# Patient Record
Sex: Female | Born: 1955 | Race: White | Hispanic: No | Smoking: Never smoker
Health system: Southern US, Community
[De-identification: ages and names within clinical notes are randomized; demographics above are authoritative.]

## PROBLEM LIST (undated history)

## (undated) DIAGNOSIS — I10 Essential (primary) hypertension: Secondary | ICD-10-CM

## (undated) DIAGNOSIS — E119 Type 2 diabetes mellitus without complications: Secondary | ICD-10-CM

---

## 2011-01-27 ENCOUNTER — Ambulatory Visit: Payer: Self-pay | Admitting: Internal Medicine

## 2013-03-06 ENCOUNTER — Ambulatory Visit: Payer: Self-pay | Admitting: Internal Medicine

## 2014-04-01 ENCOUNTER — Ambulatory Visit: Payer: Self-pay | Admitting: Internal Medicine

## 2014-10-05 ENCOUNTER — Emergency Department
Admission: EM | Admit: 2014-10-05 | Discharge: 2014-10-06 | Disposition: A | Discharge status: Home / Self Care | Payer: BLUE CROSS/BLUE SHIELD | Attending: Emergency Medicine | Admitting: Emergency Medicine

## 2014-10-05 ENCOUNTER — Encounter: Payer: Self-pay | Admitting: Emergency Medicine

## 2014-10-05 DIAGNOSIS — Z79899 Other long term (current) drug therapy: Secondary | ICD-10-CM | POA: Diagnosis not present

## 2014-10-05 DIAGNOSIS — R1031 Right lower quadrant pain: Secondary | ICD-10-CM | POA: Diagnosis present

## 2014-10-05 DIAGNOSIS — N23 Unspecified renal colic: Secondary | ICD-10-CM

## 2014-10-05 DIAGNOSIS — I10 Essential (primary) hypertension: Secondary | ICD-10-CM | POA: Diagnosis not present

## 2014-10-05 DIAGNOSIS — E119 Type 2 diabetes mellitus without complications: Secondary | ICD-10-CM | POA: Insufficient documentation

## 2014-10-05 HISTORY — DX: Essential (primary) hypertension: I10

## 2014-10-05 HISTORY — DX: Type 2 diabetes mellitus without complications: E11.9

## 2014-10-05 LAB — COMPREHENSIVE METABOLIC PANEL
ALK PHOS: 71 U/L (ref 38–126)
ALT: 32 U/L (ref 14–54)
AST: 33 U/L (ref 15–41)
Albumin: 4.6 g/dL (ref 3.5–5.0)
Anion gap: 13 (ref 5–15)
BUN: 22 mg/dL — AB (ref 6–20)
CALCIUM: 10.1 mg/dL (ref 8.9–10.3)
CO2: 25 mmol/L (ref 22–32)
Chloride: 104 mmol/L (ref 101–111)
Creatinine, Ser: 1.13 mg/dL — ABNORMAL HIGH (ref 0.44–1.00)
GFR calc Af Amer: 60 mL/min — ABNORMAL LOW (ref >60)
GFR calc non Af Amer: 52 mL/min — ABNORMAL LOW (ref >60)
Glucose, Bld: 144 mg/dL — ABNORMAL HIGH (ref 65–99)
Potassium: 3 mmol/L — ABNORMAL LOW (ref 3.5–5.1)
SODIUM: 142 mmol/L (ref 135–145)
TOTAL PROTEIN: 8.3 g/dL — AB (ref 6.5–8.1)
Total Bilirubin: 0.7 mg/dL (ref 0.3–1.2)

## 2014-10-05 LAB — CBC WITH DIFFERENTIAL/PLATELET
Basophils Absolute: 0.1 10*3/uL (ref 0–0.1)
Basophils Relative: 1 %
EOS ABS: 0.3 10*3/uL (ref 0–0.7)
Eosinophils Relative: 3 %
HEMATOCRIT: 40.7 % (ref 35.0–47.0)
HEMOGLOBIN: 13.4 g/dL (ref 12.0–16.0)
LYMPHS ABS: 2.9 10*3/uL (ref 1.0–3.6)
Lymphocytes Relative: 32 %
MCH: 29.6 pg (ref 26.0–34.0)
MCHC: 32.9 g/dL (ref 32.0–36.0)
MCV: 90 fL (ref 80.0–100.0)
MONOS PCT: 7 %
Monocytes Absolute: 0.6 10*3/uL (ref 0.2–0.9)
Neutro Abs: 5.3 10*3/uL (ref 1.4–6.5)
Neutrophils Relative %: 57 %
PLATELETS: 202 10*3/uL (ref 150–440)
RBC: 4.52 MIL/uL (ref 3.80–5.20)
RDW: 13.2 % (ref 11.5–14.5)
WBC: 9.2 10*3/uL (ref 3.6–11.0)

## 2014-10-05 LAB — TROPONIN I: Troponin I: <0.03 ng/mL (ref <0.031)

## 2014-10-05 LAB — LIPASE, BLOOD: Lipase: 41 U/L (ref 22–51)

## 2014-10-05 MED ORDER — ONDANSETRON HCL 4 MG/2ML IJ SOLN
4.0000 mg | Freq: Once | INTRAMUSCULAR | Status: AC
  Administered 2014-10-05: 4 mg via INTRAVENOUS

## 2014-10-05 MED ORDER — HYDROMORPHONE HCL 1 MG/ML IJ SOLN
1.0000 mg | INTRAMUSCULAR | Status: AC
  Administered 2014-10-05: 1 mg via INTRAVENOUS

## 2014-10-05 MED ORDER — ONDANSETRON HCL 4 MG/2ML IJ SOLN
INTRAMUSCULAR | Status: AC
  Filled 2014-10-05: qty 2

## 2014-10-05 MED ORDER — FENTANYL CITRATE (PF) 100 MCG/2ML IJ SOLN
50.0000 ug | Freq: Once | INTRAMUSCULAR | Status: AC
  Administered 2014-10-05: 50 ug via INTRAVENOUS

## 2014-10-05 MED ORDER — HYDROMORPHONE HCL 1 MG/ML IJ SOLN
INTRAMUSCULAR | Status: AC
  Administered 2014-10-05: 1 mg via INTRAVENOUS
  Filled 2014-10-05: qty 1

## 2014-10-05 MED ORDER — SODIUM CHLORIDE 0.9 % IV SOLN
Freq: Once | INTRAVENOUS | Status: AC
  Administered 2014-10-05: 23:00:00 via INTRAVENOUS

## 2014-10-05 MED ORDER — FENTANYL CITRATE (PF) 100 MCG/2ML IJ SOLN
INTRAMUSCULAR | Status: AC
  Filled 2014-10-05: qty 2

## 2014-10-05 MED ORDER — MORPHINE SULFATE 4 MG/ML IJ SOLN
4.0000 mg | Freq: Once | INTRAMUSCULAR | Status: AC
  Administered 2014-10-05: 4 mg via INTRAVENOUS

## 2014-10-05 MED ORDER — MORPHINE SULFATE 4 MG/ML IJ SOLN
INTRAMUSCULAR | Status: AC
  Administered 2014-10-05: 4 mg via INTRAVENOUS
  Filled 2014-10-05: qty 1

## 2014-10-05 NOTE — ED Notes (Signed)
MD at bedside. 

## 2014-10-05 NOTE — ED Notes (Signed)
Pt has eyes closed resting in bed with husband at bedside. Pt has become increasingly quite since arrival and has become increasingly pale in face. MD notified.

## 2014-10-05 NOTE — ED Notes (Signed)
Patient here with complaint of right lower abd and back pain that started about an hour ago. Patient with some nausea, denies vomiting.

## 2014-10-06 ENCOUNTER — Emergency Department: Payer: BLUE CROSS/BLUE SHIELD

## 2014-10-06 LAB — URINALYSIS COMPLETE WITH MICROSCOPIC (ARMC ONLY)
Bacteria, UA: NONE SEEN
Bilirubin Urine: NEGATIVE
GLUCOSE, UA: NEGATIVE mg/dL
Leukocytes, UA: NEGATIVE
NITRITE: POSITIVE — AB
PROTEIN: NEGATIVE mg/dL
SPECIFIC GRAVITY, URINE: 1.024 (ref 1.005–1.030)
pH: 8 (ref 5.0–8.0)

## 2014-10-06 MED ORDER — POLYETHYLENE GLYCOL 3350 17 GM/SCOOP PO POWD: ORAL | Status: DC

## 2014-10-06 MED ORDER — OXYCODONE-ACETAMINOPHEN 5-325 MG PO TABS: 1.0000 | ORAL_TABLET | Freq: Four times a day (QID) | ORAL | Status: DC | PRN

## 2014-10-06 MED ORDER — PHENAZOPYRIDINE HCL 200 MG PO TABS: 200.0000 mg | ORAL_TABLET | Freq: Three times a day (TID) | ORAL | Status: DC | PRN

## 2014-10-06 MED ORDER — IOHEXOL 300 MG/ML  SOLN
100.0000 mL | Freq: Once | INTRAMUSCULAR | Status: AC | PRN
  Administered 2014-10-06: 100 mL via INTRAVENOUS

## 2014-10-06 MED ORDER — TAMSULOSIN HCL 0.4 MG PO CAPS: 0.4000 mg | ORAL_CAPSULE | Freq: Every day | ORAL | Status: DC

## 2014-10-06 MED ORDER — SENNA 8.6 MG PO TABS: 2.0000 | ORAL_TABLET | Freq: Two times a day (BID) | ORAL | Status: DC

## 2014-10-06 MED ORDER — DOCUSATE SODIUM 100 MG PO CAPS: 200.0000 mg | ORAL_CAPSULE | Freq: Two times a day (BID) | ORAL | Status: DC

## 2014-10-06 MED ORDER — IOHEXOL 240 MG/ML SOLN
25.0000 mL | Freq: Once | INTRAMUSCULAR | Status: AC | PRN
  Administered 2014-10-06: 25 mL via ORAL

## 2014-10-06 NOTE — ED Notes (Signed)
Pt to CT scan.

## 2014-10-06 NOTE — ED Provider Notes (Signed)
[facility] [place] Emergency Department Provider Note  ____________________________________________  Time seen: 11:15 PM  I have reviewed the triage vital signs and the nursing notes.   HISTORY  Chief Complaint Abdominal Pain    HPI Megan Shaw is a 59 y.o. female who complains of right lower quadrant abdominal pain radiating to her back starting about 10 PM tonight. Never had pain like this before. No aggravating or alleviating factors, constant since onset, sharp, severe. No dysuria frequency urgency hematuria. No vaginal burning or discharge. No history of ovarian cysts. No abdominal surgery.  She notes she is all he had 2 bowel movements in the last 2 weeks.No vomiting, normal oral intake of food and liquids. No fever or chills.     Past Medical History  Diagnosis Date  . Diabetes mellitus without complication   . Hypertension     There are no active problems to display for this patient.   No past surgical history on file.  Current Outpatient Rx  Name  Route  Sig  Dispense  Refill  . docusate sodium (COLACE) 100 MG capsule   Oral   Take 2 capsules (200 mg total) by mouth 2 (two) times daily.   120 capsule   0   . oxyCODONE-acetaminophen (ROXICET) 5-325 MG per tablet   Oral   Take 1 tablet by mouth every 6 (six) hours as needed for severe pain.   8 tablet   0   . phenazopyridine (PYRIDIUM) 200 MG tablet   Oral   Take 1 tablet (200 mg total) by mouth 3 (three) times daily as needed for pain.   10 tablet   0   . polyethylene glycol powder (GLYCOLAX/MIRALAX) powder      2 cap fulls in a full glass of water, three times a day, for 5 days.   255 g   0   . senna (SENOKOT) 8.6 MG TABS tablet   Oral   Take 2 tablets (17.2 mg total) by mouth 2 (two) times daily.   120 each   0   . tamsulosin (FLOMAX) 0.4 MG CAPS capsule   Oral   Take 1 capsule (0.4 mg total) by mouth daily.   30 capsule   0     Allergies Review of patient's  allergies indicates no known allergies.  No family history on file.  Social History History  Substance Use Topics  . Smoking status: Never Smoker   . Smokeless tobacco: Not on file  . Alcohol Use: No    Review of Systems  Constitutional: No fever or chills. No weight changes Eyes:No blurry vision or double vision.  ENT: No sore throat. Cardiovascular: No chest pain. Respiratory: No dyspnea or cough. Gastrointestinal: Abdominal pain as above.  No BRBPR or melena. Genitourinary: Negative for dysuria, urinary retention, bloody urine, or difficulty urinating. Musculoskeletal: Negative for back pain. No joint swelling or pain. Skin: Negative for rash. Neurological: Negative for headaches, focal weakness or numbness. Psychiatric:No anxiety or depression.   Endocrine:No hot/cold intolerance, changes in energy, or sleep difficulty.  10-point ROS otherwise negative.  ____________________________________________   PHYSICAL EXAM:  VITAL SIGNS: ED Triage Vitals  Enc Vitals Group     BP 10/05/14 2151 133/64 mmHg     Pulse Rate 10/05/14 2151 75     Resp 10/05/14 2151 18     Temp 10/05/14 2156 97.5 F (36.4 C)     Temp Source 10/05/14 2156 Oral     SpO2 10/05/14 2151 100 %  Weight 10/05/14 2151 160 lb (72.576 kg)     Height 10/05/14 2151 5\' 6"  (1.676 m)     Head Cir --      Peak Flow --      Pain Score 10/05/14 2152 10     Pain Loc --      Pain Edu? --      Excl. in GC? --      Constitutional: Alert and oriented. Well appearing and in no distress. Eyes: No scleral icterus. No conjunctival pallor. PERRL. EOMI ENT   Head: Normocephalic and atraumatic.   Nose: No congestion/rhinnorhea. No septal hematoma   Mouth/Throat: MMM, no pharyngeal erythema. No peritonsillar mass. No uvula shift.   Neck: No stridor. No SubQ emphysema. No meningismus. Hematological/Lymphatic/Immunilogical: No cervical lymphadenopathy. Cardiovascular: RRR. Normal and symmetric distal  pulses are present in all extremities. No murmurs, rubs, or gallops. Respiratory: Normal respiratory effort without tachypnea nor retractions. Breath sounds are clear and equal bilaterally. No wheezes/rales/rhonchi. Gastrointestinal: Soft with right lower quadrant tenderness. There is moderate distention of the whole abdomen without tympany. There is fullness to palpation. There is no CVA tenderness.  No rebound, rigidity, or guarding. Genitourinary: deferred Musculoskeletal: Nontender with normal range of motion in all extremities. No joint effusions.  No lower extremity tenderness.  No edema. Neurologic:   Normal speech and language.  CN 2-10 normal. Motor grossly intact. No pronator drift.  Normal gait. No gross focal neurologic deficits are appreciated.  Skin:  Skin is warm, dry and intact. No rash noted.  No petechiae, purpura, or bullae. Psychiatric: Mood and affect are normal. Speech and behavior are normal. Patient exhibits appropriate insight and judgment.  ____________________________________________    LABS (pertinent positives/negatives) (all labs ordered are listed, but only abnormal results are displayed) Labs Reviewed  COMPREHENSIVE METABOLIC PANEL - Abnormal; Notable for the following:    Potassium 3.0 (*)    Glucose, Bld 144 (*)    BUN 22 (*)    Creatinine, Ser 1.13 (*)    Total Protein 8.3 (*)    GFR calc non Af Amer 52 (*)    GFR calc Af Amer 60 (*)    All other components within normal limits  URINALYSIS COMPLETEWITH MICROSCOPIC ([facility] ONLY) - Abnormal; Notable for the following:    Color, Urine YELLOW (*)    APPearance CLEAR (*)    Ketones, ur TRACE (*)    Hgb urine dipstick 1+ (*)    Nitrite POSITIVE (*)    Squamous Epithelial / LPF 0-5 (*)    All other components within normal limits  CBC WITH DIFFERENTIAL/PLATELET  LIPASE, BLOOD  TROPONIN I   ____________________________________________   EKG    ____________________________________________     RADIOLOGY  CT abdomen and pelvis reveals 3 mm right ureteropelvic junction stone, otherwise unremarkable. Multiple incidental findings which were discussed with the patient. The patient is up-to-date on mammography and other screening.  ____________________________________________   PROCEDURES  ____________________________________________   INITIAL IMPRESSION / ASSESSMENT AND PLAN / ED COURSE  Pertinent labs & imaging results that were available during my care of the patient were reviewed by me and considered in my medical decision making (see chart for details).  Possible appendicitis, we will CT abdomen and pelvis.  ----------------------------------------- 3:06 AM on 10/06/2014 -----------------------------------------  Workup reveals renal colic. No severe obstruction or pyelonephritis. We'll treat symptomatically and have her follow-up with her primary care doctor. I informed the patient of the incidental findings and she will follow-up with her doctor.  ____________________________________________   FINAL CLINICAL IMPRESSION(S) / ED DIAGNOSES  Final diagnoses:  Renal colic on right side   constipation    Sharman Cheek, MD 10/06/14 [phone]

## 2014-10-06 NOTE — ED Notes (Signed)
Pt placed on 2L [place] due to consistent oxygen saturation of 88% on room air. MD notified

## 2014-10-06 NOTE — ED Notes (Signed)
Pt alert and in NAd at time of d/c to husband 

## 2014-10-06 NOTE — Discharge Instructions (Signed)

## 2015-02-27 ENCOUNTER — Other Ambulatory Visit: Payer: Self-pay | Admitting: Internal Medicine

## 2015-02-27 DIAGNOSIS — Z1231 Encounter for screening mammogram for malignant neoplasm of breast: Secondary | ICD-10-CM

## 2015-04-03 ENCOUNTER — Ambulatory Visit
Admission: RE | Admit: 2015-04-03 | Discharge: 2015-04-03 | Disposition: A | Discharge status: Home / Self Care | Payer: BLUE CROSS/BLUE SHIELD | Source: Ambulatory Visit | Attending: Internal Medicine | Admitting: Internal Medicine

## 2015-04-03 DIAGNOSIS — Z1231 Encounter for screening mammogram for malignant neoplasm of breast: Secondary | ICD-10-CM | POA: Insufficient documentation

## 2019-05-17 ENCOUNTER — Other Ambulatory Visit: Payer: Self-pay | Admitting: Internal Medicine

## 2019-05-17 DIAGNOSIS — Z1231 Encounter for screening mammogram for malignant neoplasm of breast: Secondary | ICD-10-CM

## 2019-06-15 ENCOUNTER — Ambulatory Visit
Admission: RE | Admit: 2019-06-15 | Discharge: 2019-06-15 | Disposition: A | Discharge status: Home / Self Care | Payer: BC Managed Care – PPO | Source: Ambulatory Visit | Attending: Internal Medicine | Admitting: Internal Medicine

## 2019-06-15 DIAGNOSIS — Z1231 Encounter for screening mammogram for malignant neoplasm of breast: Secondary | ICD-10-CM | POA: Insufficient documentation

## 2020-06-06 ENCOUNTER — Other Ambulatory Visit: Payer: Self-pay | Admitting: Internal Medicine

## 2020-06-06 DIAGNOSIS — Z1231 Encounter for screening mammogram for malignant neoplasm of breast: Secondary | ICD-10-CM

## 2020-06-27 ENCOUNTER — Ambulatory Visit
Admission: RE | Admit: 2020-06-27 | Discharge: 2020-06-27 | Disposition: A | Discharge status: Home / Self Care | Payer: BC Managed Care – PPO | Source: Ambulatory Visit | Attending: Internal Medicine | Admitting: Internal Medicine

## 2020-06-27 ENCOUNTER — Other Ambulatory Visit: Payer: Self-pay

## 2020-06-27 DIAGNOSIS — Z1231 Encounter for screening mammogram for malignant neoplasm of breast: Secondary | ICD-10-CM | POA: Diagnosis present

## 2021-02-20 IMAGING — MG DIGITAL SCREENING BILAT W/ TOMO W/ CAD
8 series · 8 of 24 positions shown · non-contrast
Comparison: Previous exam(s).

CLINICAL DATA: Screening.

EXAM:
DIGITAL SCREENING BILATERAL MAMMOGRAM WITH TOMO AND CAD

[R CC synth-2D]
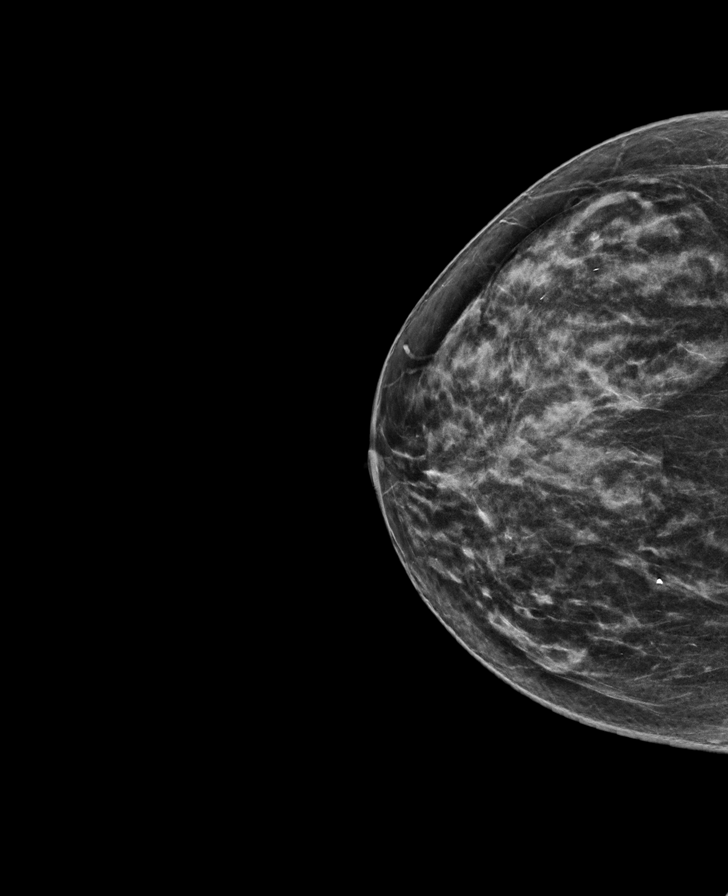

[L CC synth-2D]
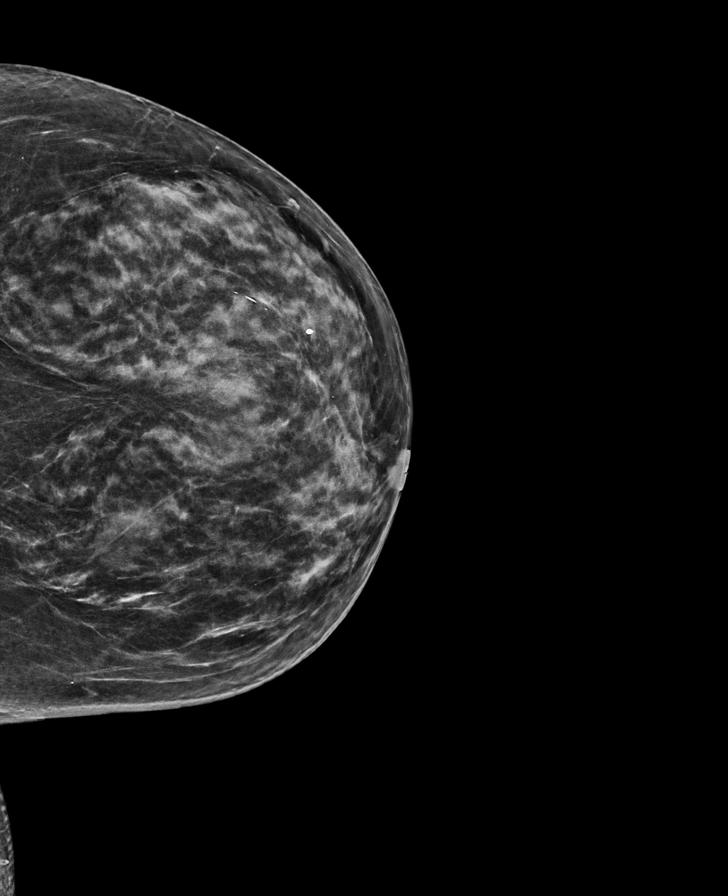

[R MLO synth-2D]
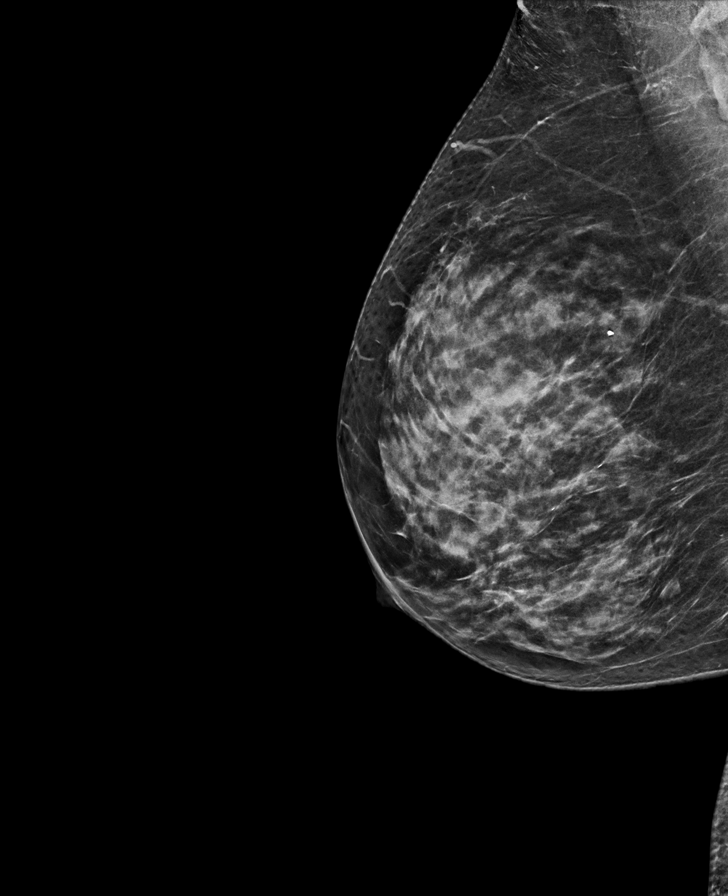

[L MLO synth-2D]
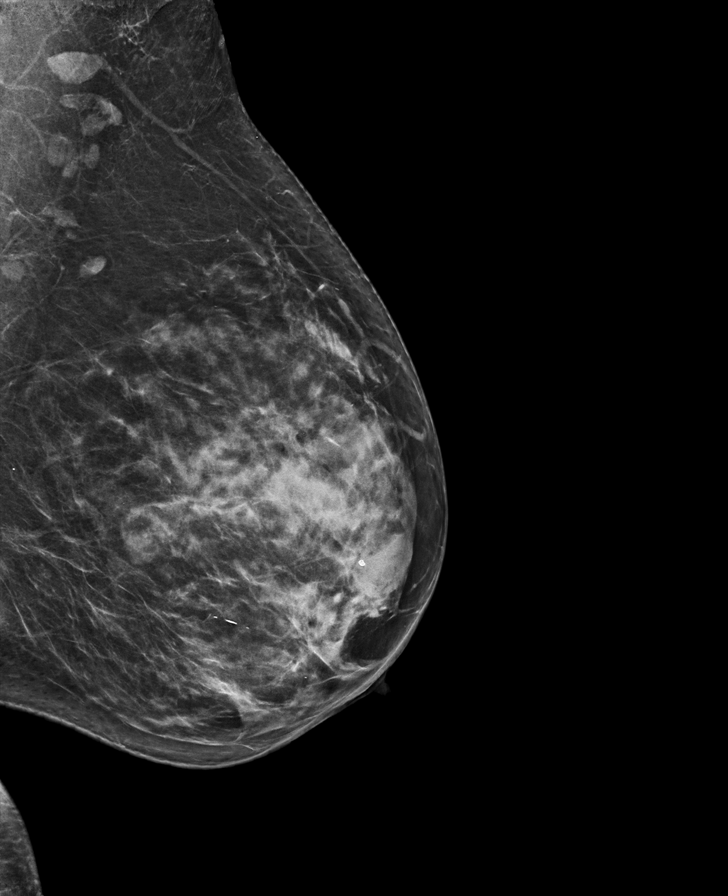

[R CC tomo · tomo slice 31/60.0]
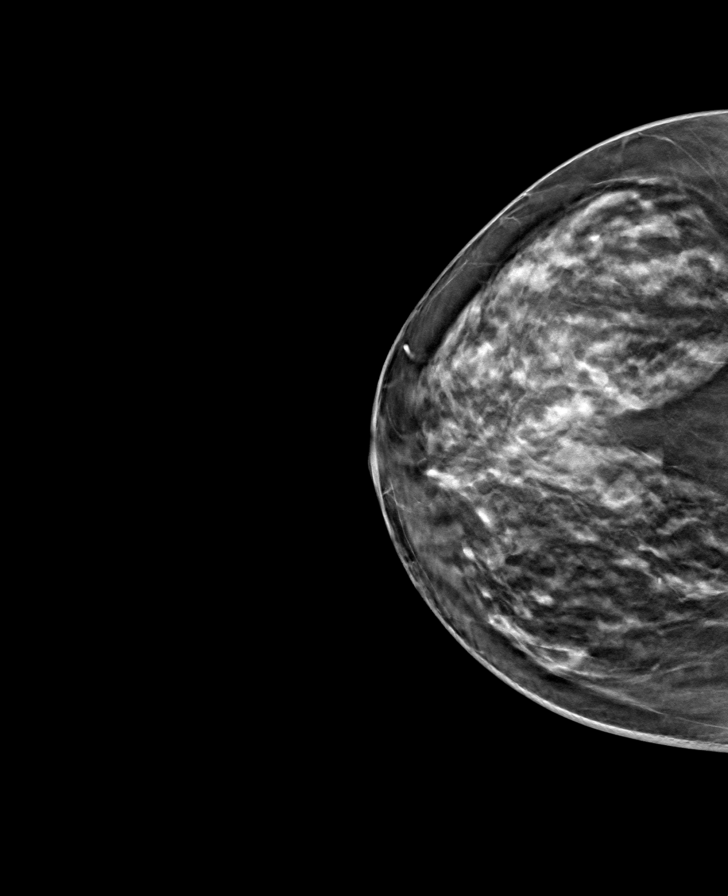

[R MLO tomo · tomo slice 31/61.0]
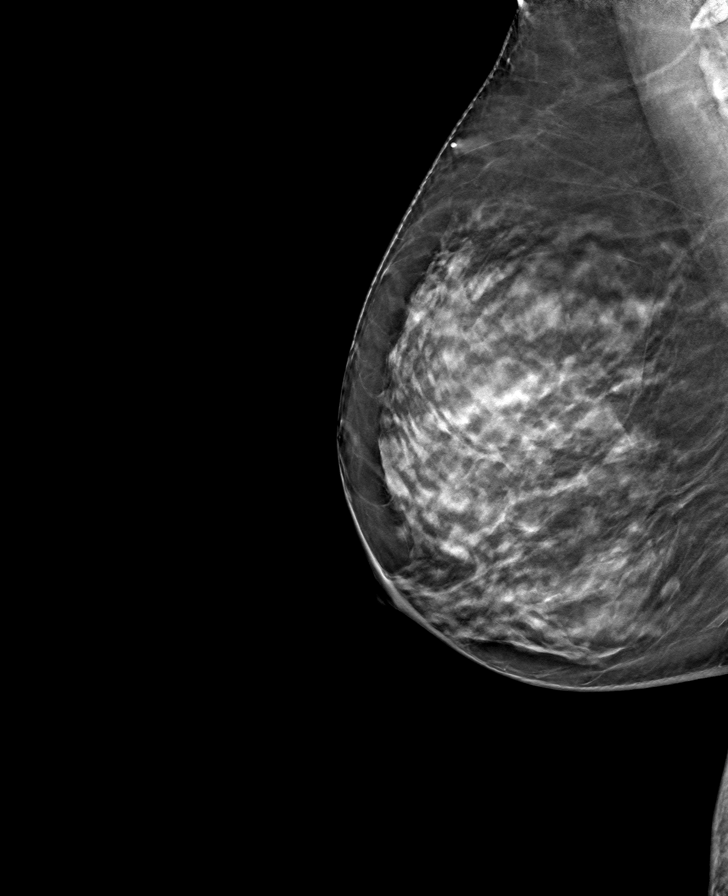

[L MLO tomo · tomo slice 34/67.0]
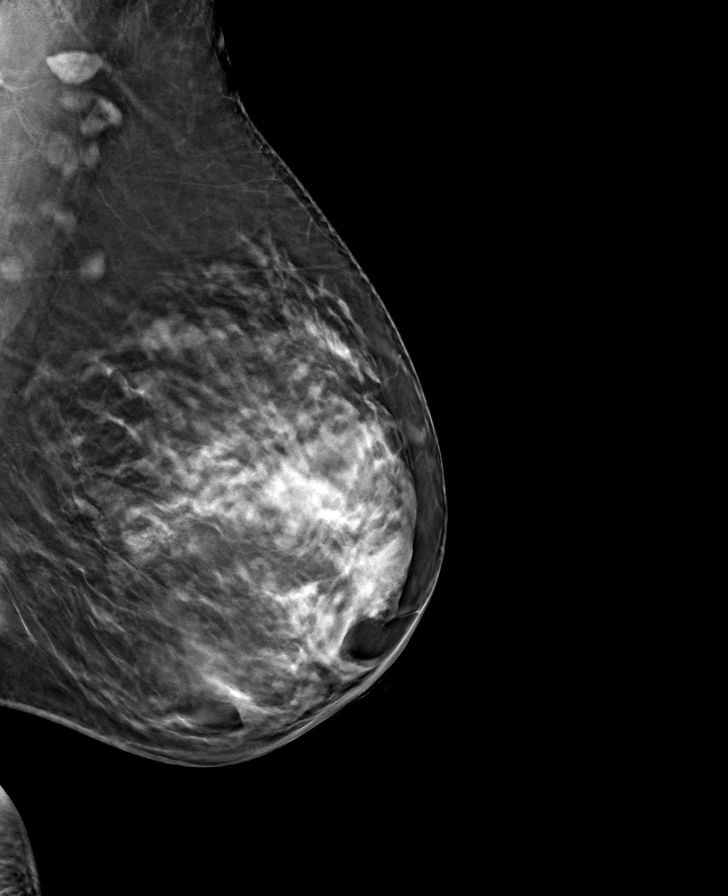

[L CC tomo · tomo slice 29/56.0]
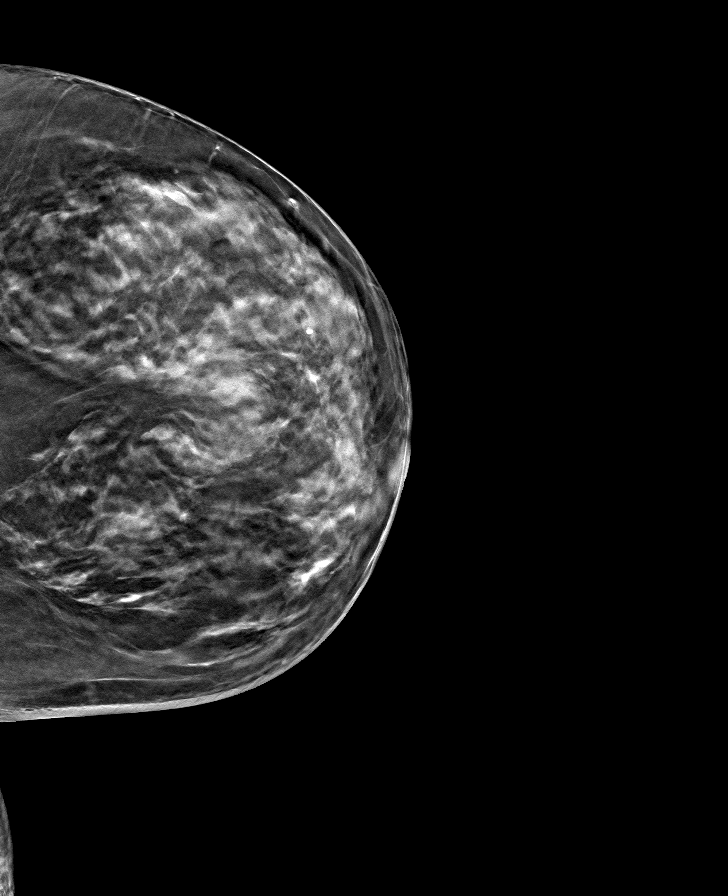

[8 of 24 positions shown; findings below may reference images not displayed]

ACR Breast Density Category c: The breast tissue is heterogeneously
dense, which may obscure small masses.
FINDINGS: There are no findings suspicious for malignancy. Images were
processed with CAD.
IMPRESSION: No mammographic evidence of malignancy. A result letter of this
screening mammogram will be mailed directly to the patient.

RECOMMENDATION:
Screening mammogram in one year. (Code:FT-U-LHB)

BI-RADS CATEGORY  1: Negative.

## 2021-04-16 ENCOUNTER — Other Ambulatory Visit: Payer: Self-pay | Admitting: Internal Medicine

## 2021-04-16 DIAGNOSIS — Z1231 Encounter for screening mammogram for malignant neoplasm of breast: Secondary | ICD-10-CM

## 2021-08-07 ENCOUNTER — Ambulatory Visit
Admission: RE | Admit: 2021-08-07 | Discharge: 2021-08-07 | Disposition: A | Discharge status: Home / Self Care | Payer: BC Managed Care – PPO | Source: Ambulatory Visit | Attending: Internal Medicine | Admitting: Internal Medicine

## 2021-08-07 DIAGNOSIS — Z1231 Encounter for screening mammogram for malignant neoplasm of breast: Secondary | ICD-10-CM | POA: Insufficient documentation

## 2022-05-11 ENCOUNTER — Other Ambulatory Visit: Payer: Self-pay | Admitting: Internal Medicine

## 2022-05-11 DIAGNOSIS — Z1231 Encounter for screening mammogram for malignant neoplasm of breast: Secondary | ICD-10-CM

## 2022-06-07 ENCOUNTER — Other Ambulatory Visit: Payer: Self-pay | Admitting: Internal Medicine

## 2022-06-07 DIAGNOSIS — N951 Menopausal and female climacteric states: Secondary | ICD-10-CM

## 2022-06-22 ENCOUNTER — Other Ambulatory Visit: Payer: Self-pay

## 2022-06-23 ENCOUNTER — Other Ambulatory Visit: Payer: Self-pay | Admitting: Internal Medicine

## 2022-06-23 ENCOUNTER — Encounter: Payer: Self-pay | Admitting: Internal Medicine

## 2022-06-23 DIAGNOSIS — M8589 Other specified disorders of bone density and structure, multiple sites: Secondary | ICD-10-CM

## 2022-06-23 DIAGNOSIS — N951 Menopausal and female climacteric states: Secondary | ICD-10-CM

## 2022-07-27 ENCOUNTER — Ambulatory Visit (INDEPENDENT_AMBULATORY_CARE_PROVIDER_SITE_OTHER): Payer: Self-pay

## 2022-07-27 DIAGNOSIS — N951 Menopausal and female climacteric states: Secondary | ICD-10-CM

## 2022-07-27 DIAGNOSIS — M8589 Other specified disorders of bone density and structure, multiple sites: Secondary | ICD-10-CM

## 2022-08-03 ENCOUNTER — Other Ambulatory Visit: Payer: Self-pay

## 2022-08-03 MED ORDER — IBANDRONATE SODIUM 150 MG PO TABS: 150.0000 mg | ORAL_TABLET | ORAL | 3 refills | Status: DC

## 2022-08-24 ENCOUNTER — Other Ambulatory Visit: Payer: Self-pay

## 2022-08-24 DIAGNOSIS — E785 Hyperlipidemia, unspecified: Secondary | ICD-10-CM

## 2022-08-24 DIAGNOSIS — E663 Overweight: Secondary | ICD-10-CM

## 2022-08-24 DIAGNOSIS — I10 Essential (primary) hypertension: Secondary | ICD-10-CM

## 2022-08-24 DIAGNOSIS — E119 Type 2 diabetes mellitus without complications: Secondary | ICD-10-CM

## 2022-08-25 LAB — LIPID PANEL
Chol/HDL Ratio: 2 ratio (ref 0.0–4.4)
Cholesterol, Total: 117 mg/dL (ref 100–199)
HDL: 59 mg/dL (ref >39)
LDL Chol Calc (NIH): 42 mg/dL (ref 0–99)
Triglycerides: 80 mg/dL (ref 0–149)
VLDL Cholesterol Cal: 16 mg/dL (ref 5–40)

## 2022-08-25 LAB — HEMOGLOBIN A1C
Est. average glucose Bld gHb Est-mCnc: 143 mg/dL
Hgb A1c MFr Bld: 6.6 % — ABNORMAL HIGH (ref 4.8–5.6)

## 2022-08-25 LAB — CMP14+EGFR
ALT: 15 IU/L (ref 0–32)
AST: 17 IU/L (ref 0–40)
Albumin/Globulin Ratio: 1.9 (ref 1.2–2.2)
Albumin: 4.3 g/dL (ref 3.9–4.9)
Alkaline Phosphatase: 79 IU/L (ref 44–121)
BUN/Creatinine Ratio: 14 (ref 12–28)
BUN: 13 mg/dL (ref 8–27)
Bilirubin Total: 0.4 mg/dL (ref 0.0–1.2)
CO2: 24 mmol/L (ref 20–29)
Calcium: 9.9 mg/dL (ref 8.7–10.3)
Chloride: 104 mmol/L (ref 96–106)
Creatinine, Ser: 0.95 mg/dL (ref 0.57–1.00)
Globulin, Total: 2.3 g/dL (ref 1.5–4.5)
Glucose: 102 mg/dL — ABNORMAL HIGH (ref 70–99)
Potassium: 4.3 mmol/L (ref 3.5–5.2)
Sodium: 141 mmol/L (ref 134–144)
Total Protein: 6.6 g/dL (ref 6.0–8.5)
eGFR: 65 mL/min/{1.73_m2} (ref >59)

## 2022-08-25 LAB — CBC WITH DIFFERENTIAL
Basophils Absolute: 0.1 10*3/uL (ref 0.0–0.2)
Basos: 1 %
EOS (ABSOLUTE): 0.2 10*3/uL (ref 0.0–0.4)
Eos: 4 %
Hematocrit: 39.6 % (ref 34.0–46.6)
Hemoglobin: 12.8 g/dL (ref 11.1–15.9)
Immature Grans (Abs): 0 10*3/uL (ref 0.0–0.1)
Immature Granulocytes: 0 %
Lymphocytes Absolute: 1.7 10*3/uL (ref 0.7–3.1)
Lymphs: 28 %
MCH: 29.4 pg (ref 26.6–33.0)
MCHC: 32.3 g/dL (ref 31.5–35.7)
MCV: 91 fL (ref 79–97)
Monocytes Absolute: 0.5 10*3/uL (ref 0.1–0.9)
Monocytes: 8 %
Neutrophils Absolute: 3.8 10*3/uL (ref 1.4–7.0)
Neutrophils: 59 %
RBC: 4.36 x10E6/uL (ref 3.77–5.28)
RDW: 12.1 % (ref 11.7–15.4)
WBC: 6.3 10*3/uL (ref 3.4–10.8)

## 2022-08-25 LAB — TSH: TSH: 0.93 u[IU]/mL (ref 0.450–4.500)

## 2022-08-26 ENCOUNTER — Other Ambulatory Visit: Payer: Self-pay | Admitting: Internal Medicine

## 2022-08-26 ENCOUNTER — Encounter: Payer: Self-pay | Admitting: Internal Medicine

## 2022-08-26 ENCOUNTER — Ambulatory Visit: Payer: Self-pay | Admitting: Internal Medicine

## 2022-08-26 VITALS — BP 124/78 | HR 77 | Ht 66.0 in | Wt 147.6 lb

## 2022-08-26 DIAGNOSIS — E782 Mixed hyperlipidemia: Secondary | ICD-10-CM

## 2022-08-26 DIAGNOSIS — I10 Essential (primary) hypertension: Secondary | ICD-10-CM | POA: Diagnosis not present

## 2022-08-26 DIAGNOSIS — E1165 Type 2 diabetes mellitus with hyperglycemia: Secondary | ICD-10-CM | POA: Insufficient documentation

## 2022-08-26 LAB — POCT CBG (FASTING - GLUCOSE)-MANUAL ENTRY: Glucose Fasting, POC: 127 mg/dL — AB (ref 70–99)

## 2022-08-26 NOTE — Progress Notes (Signed)
Established Patient Office Visit  Subjective:  Patient ID: Megan Shaw, female    DOB: [birth year]  Age: 67 y.o. MRN: [id number]  Chief Complaint  Patient presents with   Follow-up    Follow up    Patient comes in for her follow-up today.  She recently had blood work done which shows an improvement in her hemoglobin A1c.  Patient reports that she has been taking her medications regularly along with a strict diet control and exercise. She is due for a mammogram and already has a date scheduled. She is also getting her colonoscopy later on this year.    No other concerns at this time.   Past Medical History:  Diagnosis Date   Diabetes mellitus without complication    Hypertension     History reviewed. No pertinent surgical history.  Social History   Socioeconomic History   Marital status: Divorced    Spouse name: Not on file   Number of children: Not on file   Years of education: Not on file   Highest education level: Not on file  Occupational History   Not on file  Tobacco Use   Smoking status: Never   Smokeless tobacco: Never  Substance and Sexual Activity   Alcohol use: No   Drug use: No   Sexual activity: Not on file  Other Topics Concern   Not on file  Social History Narrative   Not on file   Social Determinants of Health   Financial Resource Strain: Not on file  Food Insecurity: Not on file  Transportation Needs: Not on file  Physical Activity: Not on file  Stress: Not on file  Social Connections: Not on file  Intimate Partner Violence: Not on file    Family History  Problem Relation Age of Onset   Arthritis Mother    Arthritis Father    Breast cancer Cousin     No Known Allergies  Review of Systems  Constitutional:  Negative for chills, diaphoresis, fever, malaise/fatigue and weight loss.  HENT: Negative.    Eyes: Negative.   Respiratory: Negative.    Cardiovascular: Negative.   Gastrointestinal: Negative.   Musculoskeletal:  Negative.   Neurological: Negative.   Psychiatric/Behavioral: Negative.         Objective:   BP 124/78   Pulse 77   Ht  (1.676 m)   Wt 147 lb 9.6 oz (67 kg)   SpO2 93%   BMI 23.82 kg/m   Vitals:   08/26/22 0919  BP: 124/78  Pulse: 77  Height:  (1.676 m)  Weight: 147 lb 9.6 oz (67 kg)  SpO2: 93%  BMI (Calculated): 23.83    Physical Exam Vitals and nursing note reviewed.  Constitutional:      Appearance: Normal appearance.  Cardiovascular:     Rate and Rhythm: Normal rate and regular rhythm.     Pulses: Normal pulses.     Heart sounds: Murmur heard.  Pulmonary:     Effort: Pulmonary effort is normal.     Breath sounds: Normal breath sounds.  Abdominal:     General: Abdomen is flat. Bowel sounds are normal.     Palpations: Abdomen is soft.  Musculoskeletal:        General: Normal range of motion.     Cervical back: Normal range of motion and neck supple.  Neurological:     General: No focal deficit present.     Mental Status: She is alert and oriented to person, place,  and time.  Psychiatric:        Mood and Affect: Mood normal.        Behavior: Behavior normal.      Results for orders placed or performed in visit on 08/26/22  POCT CBG (Fasting - Glucose)  Result Value Ref Range   Glucose Fasting, POC 127 (A) 70 - 99 mg/dL    Recent Results (from the past 2160 hour(s))  Hemoglobin A1c     Status: Abnormal   Collection Time: 08/24/22  9:40 AM  Result Value Ref Range   Hgb A1c MFr Bld 6.6 (H) 4.8 - 5.6 %    Comment:          Prediabetes: 5.7 - 6.4          Diabetes: >6.4          Glycemic control for adults with diabetes: <7.0    Est. average glucose Bld gHb Est-mCnc 143 mg/dL  TSH     Status: None   Collection Time: 08/24/22  9:40 AM  Result Value Ref Range   TSH 0.930 0.450 - 4.500 uIU/mL  CMP14+EGFR     Status: Abnormal   Collection Time: 08/24/22  9:40 AM  Result Value Ref Range   Glucose 102 (H) 70 - 99 mg/dL   BUN 13 8 - 27  mg/dL   Creatinine, Ser 2.95 0.57 - 1.00 mg/dL   eGFR 65 >62 ZH/YQM/5.78   BUN/Creatinine Ratio 14 12 - 28   Sodium 141 134 - 144 mmol/L   Potassium 4.3 3.5 - 5.2 mmol/L   Chloride 104 96 - 106 mmol/L   CO2 24 20 - 29 mmol/L   Calcium 9.9 8.7 - 10.3 mg/dL   Total Protein 6.6 6.0 - 8.5 g/dL   Albumin 4.3 3.9 - 4.9 g/dL   Globulin, Total 2.3 1.5 - 4.5 g/dL   Albumin/Globulin Ratio 1.9 1.2 - 2.2   Bilirubin Total 0.4 0.0 - 1.2 mg/dL   Alkaline Phosphatase 79 44 - 121 IU/L   AST 17 0 - 40 IU/L   ALT 15 0 - 32 IU/L  Lipid panel     Status: None   Collection Time: 08/24/22  9:40 AM  Result Value Ref Range   Cholesterol, Total 117 100 - 199 mg/dL   Triglycerides 80 0 - 149 mg/dL   HDL 59 >46 mg/dL   VLDL Cholesterol Cal 16 5 - 40 mg/dL   LDL Chol Calc (NIH) 42 0 - 99 mg/dL   Chol/HDL Ratio 2.0 0.0 - 4.4 ratio    Comment:                                   T. Chol/HDL Ratio                                             Men  Women                               1/2 Avg.Risk  3.4    3.3                                   Avg.Risk  5.0  4.4                                2X Avg.Risk  9.6    7.1                                3X Avg.Risk 23.4   11.0   CBC With Differential     Status: None   Collection Time: 08/24/22  9:40 AM  Result Value Ref Range   WBC 6.3 3.4 - 10.8 x10E3/uL   RBC 4.36 3.77 - 5.28 x10E6/uL   Hemoglobin 12.8 11.1 - 15.9 g/dL   Hematocrit 09.8 11.9 - 46.6 %   MCV 91 79 - 97 fL   MCH 29.4 26.6 - 33.0 pg   MCHC 32.3 31.5 - 35.7 g/dL   RDW 14.7 82.9 - 56.2 %   Neutrophils 59 Not Estab. %   Lymphs 28 Not Estab. %   Monocytes 8 Not Estab. %   Eos 4 Not Estab. %   Basos 1 Not Estab. %   Neutrophils Absolute 3.8 1.4 - 7.0 x10E3/uL   Lymphocytes Absolute 1.7 0.7 - 3.1 x10E3/uL   Monocytes Absolute 0.5 0.1 - 0.9 x10E3/uL   EOS (ABSOLUTE) 0.2 0.0 - 0.4 x10E3/uL   Basophils Absolute 0.1 0.0 - 0.2 x10E3/uL   Immature Granulocytes 0 Not Estab. %   Immature Grans (Abs)  0.0 0.0 - 0.1 x10E3/uL  POCT CBG (Fasting - Glucose)     Status: Abnormal   Collection Time: 08/26/22  9:23 AM  Result Value Ref Range   Glucose Fasting, POC 127 (A) 70 - 99 mg/dL      Assessment & Plan:  Patient advised to continue taking all her medications as such. Will proceed with her mammogram and colon cancer screening. Problem List Items Addressed This Visit     Type 2 diabetes mellitus with hyperglycemia, without long-term current use of insulin - Primary   Relevant Medications   enalapril (VASOTEC) 2.5 MG tablet   metFORMIN (GLUCOPHAGE) 500 MG tablet   rosuvastatin (CRESTOR) 40 MG tablet   Other Relevant Orders   POCT CBG (Fasting - Glucose) (Completed)   Essential hypertension, benign   Relevant Medications   amLODipine (NORVASC) 5 MG tablet   enalapril (VASOTEC) 2.5 MG tablet   furosemide (LASIX) 20 MG tablet   rosuvastatin (CRESTOR) 40 MG tablet   Mixed hyperlipidemia   Relevant Medications   amLODipine (NORVASC) 5 MG tablet   enalapril (VASOTEC) 2.5 MG tablet   furosemide (LASIX) 20 MG tablet   rosuvastatin (CRESTOR) 40 MG tablet    Return in about 4 months (around 12/26/2022).   Total time spent: 30 minutes  Margaretann Loveless, MD  08/26/2022

## 2022-09-07 ENCOUNTER — Ambulatory Visit: Payer: Self-pay

## 2022-09-07 DIAGNOSIS — K640 First degree hemorrhoids: Secondary | ICD-10-CM

## 2022-09-07 DIAGNOSIS — Z1211 Encounter for screening for malignant neoplasm of colon: Secondary | ICD-10-CM

## 2022-09-16 ENCOUNTER — Ambulatory Visit
Admission: RE | Admit: 2022-09-16 | Discharge: 2022-09-16 | Disposition: A | Discharge status: Home / Self Care | Payer: BC Managed Care – PPO | Source: Ambulatory Visit | Attending: Internal Medicine | Admitting: Internal Medicine

## 2022-09-16 DIAGNOSIS — Z1231 Encounter for screening mammogram for malignant neoplasm of breast: Secondary | ICD-10-CM | POA: Diagnosis not present

## 2022-11-08 ENCOUNTER — Other Ambulatory Visit: Payer: Self-pay | Admitting: Internal Medicine

## 2022-12-22 ENCOUNTER — Other Ambulatory Visit: Payer: Self-pay | Admitting: Internal Medicine

## 2022-12-22 ENCOUNTER — Other Ambulatory Visit: Payer: Self-pay

## 2022-12-22 DIAGNOSIS — I10 Essential (primary) hypertension: Secondary | ICD-10-CM

## 2022-12-22 DIAGNOSIS — E1165 Type 2 diabetes mellitus with hyperglycemia: Secondary | ICD-10-CM

## 2022-12-22 DIAGNOSIS — E782 Mixed hyperlipidemia: Secondary | ICD-10-CM

## 2022-12-22 DIAGNOSIS — Z1329 Encounter for screening for other suspected endocrine disorder: Secondary | ICD-10-CM

## 2022-12-23 LAB — CBC WITH DIFFERENTIAL/PLATELET
Basophils Absolute: 0.1 10*3/uL (ref 0.0–0.2)
Basos: 1 %
EOS (ABSOLUTE): 0.3 10*3/uL (ref 0.0–0.4)
Eos: 4 %
Hematocrit: 39.7 % (ref 34.0–46.6)
Hemoglobin: 12.5 g/dL (ref 11.1–15.9)
Immature Grans (Abs): 0 10*3/uL (ref 0.0–0.1)
Immature Granulocytes: 0 %
Lymphocytes Absolute: 1.9 10*3/uL (ref 0.7–3.1)
Lymphs: 30 %
MCH: 28.1 pg (ref 26.6–33.0)
MCHC: 31.5 g/dL (ref 31.5–35.7)
MCV: 89 fL (ref 79–97)
Monocytes Absolute: 0.5 10*3/uL (ref 0.1–0.9)
Monocytes: 8 %
Neutrophils Absolute: 3.5 10*3/uL (ref 1.4–7.0)
Neutrophils: 57 %
Platelets: 203 10*3/uL (ref 150–450)
RBC: 4.45 x10E6/uL (ref 3.77–5.28)
RDW: 12.6 % (ref 11.7–15.4)
WBC: 6.3 10*3/uL (ref 3.4–10.8)

## 2022-12-23 LAB — CMP14+EGFR
ALT: 19 IU/L (ref 0–32)
AST: 20 IU/L (ref 0–40)
Albumin: 4.6 g/dL (ref 3.9–4.9)
Alkaline Phosphatase: 59 IU/L (ref 44–121)
BUN/Creatinine Ratio: 15 (ref 12–28)
BUN: 16 mg/dL (ref 8–27)
Bilirubin Total: 0.4 mg/dL (ref 0.0–1.2)
CO2: 21 mmol/L (ref 20–29)
Calcium: 9.8 mg/dL (ref 8.7–10.3)
Chloride: 103 mmol/L (ref 96–106)
Creatinine, Ser: 1.06 mg/dL — ABNORMAL HIGH (ref 0.57–1.00)
Globulin, Total: 2.2 g/dL (ref 1.5–4.5)
Glucose: 103 mg/dL — ABNORMAL HIGH (ref 70–99)
Potassium: 4.3 mmol/L (ref 3.5–5.2)
Sodium: 142 mmol/L (ref 134–144)
Total Protein: 6.8 g/dL (ref 6.0–8.5)
eGFR: 57 mL/min/{1.73_m2} — ABNORMAL LOW (ref >59)

## 2022-12-23 LAB — TSH: TSH: 0.928 u[IU]/mL (ref 0.450–4.500)

## 2022-12-23 LAB — HEMOGLOBIN A1C
Est. average glucose Bld gHb Est-mCnc: 148 mg/dL
Hgb A1c MFr Bld: 6.8 % — ABNORMAL HIGH (ref 4.8–5.6)

## 2022-12-27 ENCOUNTER — Ambulatory Visit (INDEPENDENT_AMBULATORY_CARE_PROVIDER_SITE_OTHER): Payer: Self-pay | Admitting: Internal Medicine

## 2022-12-27 ENCOUNTER — Encounter: Payer: Self-pay | Admitting: Internal Medicine

## 2022-12-27 VITALS — BP 106/70 | HR 82 | Ht 66.0 in | Wt 146.0 lb

## 2022-12-27 DIAGNOSIS — E782 Mixed hyperlipidemia: Secondary | ICD-10-CM | POA: Diagnosis not present

## 2022-12-27 DIAGNOSIS — E1165 Type 2 diabetes mellitus with hyperglycemia: Secondary | ICD-10-CM

## 2022-12-27 DIAGNOSIS — I10 Essential (primary) hypertension: Secondary | ICD-10-CM

## 2022-12-27 LAB — POC CREATINE & ALBUMIN,URINE
Albumin/Creatinine Ratio, Urine, POC: >300
Creatinine, POC: 50 mg/dL
Microalbumin Ur, POC: 150 mg/L

## 2022-12-27 LAB — POCT CBG (FASTING - GLUCOSE)-MANUAL ENTRY: Glucose Fasting, POC: 147 mg/dL — AB (ref 70–99)

## 2022-12-27 NOTE — Progress Notes (Signed)
Established Patient Office Visit  Subjective:  Patient ID: Megan Shaw, female    DOB: 07-24-1955  Age: 67 y.o. MRN: [id number]  Chief Complaint  Patient presents with   Follow-up    4 month follow up    Patient comes in for follow-up today.  She is trying to control her diet and do regular exercises but lost only 1 pound this visit. Labs were done earlier and results were discussed ,her hemoglobin A1c has jumped a little bit.  Patient admits to some dietary indiscretions.  Her creatinine was also up a little and admits to not drinking enough water. She will start following a strict diet and continue current medications.  If her hemoglobin A1c is still up at next lab draw we will add [place].    No other concerns at this time.   Past Medical History:  Diagnosis Date   Diabetes mellitus without complication (HCC)    Hypertension     History reviewed. No pertinent surgical history.  Social History   Socioeconomic History   Marital status: Divorced    Spouse name: Not on file   Number of children: Not on file   Years of education: Not on file   Highest education level: Not on file  Occupational History   Not on file  Tobacco Use   Smoking status: Never   Smokeless tobacco: Never  Substance and Sexual Activity   Alcohol use: No   Drug use: No   Sexual activity: Not on file  Other Topics Concern   Not on file  Social History Narrative   Not on file   Social Determinants of Health   Financial Resource Strain: Not on file  Food Insecurity: Not on file  Transportation Needs: Not on file  Physical Activity: Not on file  Stress: Not on file  Social Connections: Not on file  Intimate Partner Violence: Not on file    Family History  Problem Relation Age of Onset   Arthritis Mother    Arthritis Father    Breast cancer Cousin     No Known Allergies  Review of Systems  Constitutional: Negative.   HENT: Negative.    Eyes: Negative.   Respiratory: Negative.   Negative for cough and shortness of breath.   Cardiovascular: Negative.  Negative for chest pain, palpitations and leg swelling.  Gastrointestinal: Negative.  Negative for abdominal pain, constipation, diarrhea, heartburn, nausea and vomiting.  Genitourinary: Negative.  Negative for dysuria and flank pain.  Musculoskeletal: Negative.  Negative for joint pain and myalgias.  Skin: Negative.   Neurological: Negative.  Negative for dizziness and headaches.  Endo/Heme/Allergies: Negative.   Psychiatric/Behavioral: Negative.  Negative for depression and suicidal ideas. The patient is not nervous/anxious.        Objective:   BP 106/70   Pulse 82   Ht 5\' 6"  (1.676 m)   Wt 146 lb (66.2 kg)   SpO2 96%   BMI 23.57 kg/m   Vitals:   12/27/22 0925  BP: 106/70  Pulse: 82  Height: 5\' 6"  (1.676 m)  Weight: 146 lb (66.2 kg)  SpO2: 96%  BMI (Calculated): 23.58    Physical Exam Vitals and nursing note reviewed.  Constitutional:      Appearance: Normal appearance.  HENT:     Head: Normocephalic and atraumatic.     Nose: Nose normal.     Mouth/Throat:     Mouth: Mucous membranes are moist.     Pharynx: Oropharynx is clear.  Eyes:     Conjunctiva/sclera: Conjunctivae normal.     Pupils: Pupils are equal, round, and reactive to light.  Cardiovascular:     Rate and Rhythm: Normal rate and regular rhythm.     Pulses: Normal pulses.     Heart sounds: Normal heart sounds. No murmur heard. Pulmonary:     Effort: Pulmonary effort is normal.     Breath sounds: Normal breath sounds. No wheezing.  Abdominal:     General: Bowel sounds are normal.     Palpations: Abdomen is soft.     Tenderness: There is no abdominal tenderness. There is no right CVA tenderness or left CVA tenderness.  Musculoskeletal:        General: Normal range of motion.     Cervical back: Normal range of motion.     Right lower leg: No edema.     Left lower leg: No edema.  Skin:    General: Skin is warm and dry.   Neurological:     General: No focal deficit present.     Mental Status: She is alert and oriented to person, place, and time.  Psychiatric:        Mood and Affect: Mood normal.        Behavior: Behavior normal.      Results for orders placed or performed in visit on 12/27/22  POCT CBG (Fasting - Glucose)  Result Value Ref Range   Glucose Fasting, POC 147 (A) 70 - 99 mg/dL  POC CREATINE & ALBUMIN,URINE  Result Value Ref Range   Microalbumin Ur, POC 150 mg/L   Creatinine, POC 50 mg/dL   Albumin/Creatinine Ratio, Urine, POC >300     Recent Results (from the past 2160 hour(s))  Hemoglobin A1c     Status: Abnormal   Collection Time: 12/22/22 11:47 AM  Result Value Ref Range   Hgb A1c MFr Bld 6.8 (H) 4.8 - 5.6 %    Comment:          Prediabetes: 5.7 - 6.4          Diabetes: >6.4          Glycemic control for adults with diabetes: <7.0    Est. average glucose Bld gHb Est-mCnc 148 mg/dL  TSH     Status: None   Collection Time: 12/22/22 11:47 AM  Result Value Ref Range   TSH 0.928 0.450 - 4.500 uIU/mL  CMP14+EGFR     Status: Abnormal   Collection Time: 12/22/22 11:47 AM  Result Value Ref Range   Glucose 103 (H) 70 - 99 mg/dL   BUN 16 8 - 27 mg/dL   Creatinine, Ser 9.60 (H) 0.57 - 1.00 mg/dL   eGFR 57 (L) >45 WU/JWJ/1.91   BUN/Creatinine Ratio 15 12 - 28   Sodium 142 134 - 144 mmol/L   Potassium 4.3 3.5 - 5.2 mmol/L   Chloride 103 96 - 106 mmol/L   CO2 21 20 - 29 mmol/L   Calcium 9.8 8.7 - 10.3 mg/dL   Total Protein 6.8 6.0 - 8.5 g/dL   Albumin 4.6 3.9 - 4.9 g/dL   Globulin, Total 2.2 1.5 - 4.5 g/dL   Bilirubin Total 0.4 0.0 - 1.2 mg/dL   Alkaline Phosphatase 59 44 - 121 IU/L   AST 20 0 - 40 IU/L   ALT 19 0 - 32 IU/L  CBC with Diff     Status: None   Collection Time: 12/22/22 11:47 AM  Result Value Ref Range   WBC  6.3 3.4 - 10.8 x10E3/uL   RBC 4.45 3.77 - 5.28 x10E6/uL   Hemoglobin 12.5 11.1 - 15.9 g/dL   Hematocrit 96.0 45.4 - 46.6 %   MCV 89 79 - 97 fL   MCH  28.1 26.6 - 33.0 pg   MCHC 31.5 31.5 - 35.7 g/dL   RDW 09.8 11.9 - 14.7 %   Platelets 203 150 - 450 x10E3/uL   Neutrophils 57 Not Estab. %   Lymphs 30 Not Estab. %   Monocytes 8 Not Estab. %   Eos 4 Not Estab. %   Basos 1 Not Estab. %   Neutrophils Absolute 3.5 1.4 - 7.0 x10E3/uL   Lymphocytes Absolute 1.9 0.7 - 3.1 x10E3/uL   Monocytes Absolute 0.5 0.1 - 0.9 x10E3/uL   EOS (ABSOLUTE) 0.3 0.0 - 0.4 x10E3/uL   Basophils Absolute 0.1 0.0 - 0.2 x10E3/uL   Immature Granulocytes 0 Not Estab. %   Immature Grans (Abs) 0.0 0.0 - 0.1 x10E3/uL  POCT CBG (Fasting - Glucose)     Status: Abnormal   Collection Time: 12/27/22  9:31 AM  Result Value Ref Range   Glucose Fasting, POC 147 (A) 70 - 99 mg/dL  POC CREATINE & ALBUMIN,URINE     Status: None   Collection Time: 12/27/22 10:01 AM  Result Value Ref Range   Microalbumin Ur, POC 150 mg/L   Creatinine, POC 50 mg/dL   Albumin/Creatinine Ratio, Urine, POC >300       Assessment & Plan:  To continue current medications and a strict diet control.  Fasting labs before next visit. Problem List Items Addressed This Visit     Type 2 diabetes mellitus with hyperglycemia, without long-term current use of insulin (HCC) - Primary   Relevant Orders   POCT CBG (Fasting - Glucose) (Completed)   POC CREATINE & ALBUMIN,URINE (Completed)   Essential hypertension, benign   Mixed hyperlipidemia    Return in about 3 months (around 03/29/2023).   Total time spent: 30 minutes  Margaretann Loveless, MD  12/27/2022   This document may have been prepared by [facility] Voice Recognition software and as such may include unintentional dictation errors.

## 2023-01-28 ENCOUNTER — Other Ambulatory Visit: Payer: Self-pay | Admitting: Internal Medicine

## 2023-02-25 ENCOUNTER — Other Ambulatory Visit: Payer: Self-pay | Admitting: Internal Medicine

## 2023-02-25 DIAGNOSIS — E1165 Type 2 diabetes mellitus with hyperglycemia: Secondary | ICD-10-CM

## 2023-03-02 ENCOUNTER — Telehealth: Payer: Self-pay | Admitting: Internal Medicine

## 2023-03-02 NOTE — Telephone Encounter (Signed)
Patient left VM needing a refill but did not state which medication she is needing.

## 2023-03-03 ENCOUNTER — Other Ambulatory Visit: Payer: Self-pay

## 2023-03-04 ENCOUNTER — Other Ambulatory Visit: Payer: Self-pay

## 2023-03-04 ENCOUNTER — Telehealth: Payer: Self-pay

## 2023-03-04 NOTE — Telephone Encounter (Signed)
Patient is requesting call back  about medications

## 2023-03-11 ENCOUNTER — Other Ambulatory Visit: Payer: Self-pay | Admitting: Internal Medicine

## 2023-03-29 ENCOUNTER — Ambulatory Visit: Payer: Self-pay | Admitting: Internal Medicine

## 2023-04-21 ENCOUNTER — Other Ambulatory Visit: Payer: Self-pay | Admitting: Internal Medicine

## 2023-05-06 ENCOUNTER — Other Ambulatory Visit: Payer: Medicare Other

## 2023-05-06 DIAGNOSIS — I10 Essential (primary) hypertension: Secondary | ICD-10-CM

## 2023-05-06 DIAGNOSIS — E782 Mixed hyperlipidemia: Secondary | ICD-10-CM

## 2023-05-07 LAB — LIPID PANEL
Chol/HDL Ratio: 2.7 {ratio} (ref 0.0–4.4)
Cholesterol, Total: 117 mg/dL (ref 100–199)
HDL: 44 mg/dL (ref >39)
LDL Chol Calc (NIH): 54 mg/dL (ref 0–99)
Triglycerides: 105 mg/dL (ref 0–149)
VLDL Cholesterol Cal: 19 mg/dL (ref 5–40)

## 2023-05-09 ENCOUNTER — Encounter: Payer: Self-pay | Admitting: Internal Medicine

## 2023-05-09 ENCOUNTER — Ambulatory Visit (INDEPENDENT_AMBULATORY_CARE_PROVIDER_SITE_OTHER): Payer: Medicare Other | Admitting: Internal Medicine

## 2023-05-09 VITALS — BP 124/84 | HR 81 | Ht 66.0 in | Wt 149.2 lb

## 2023-05-09 DIAGNOSIS — I152 Hypertension secondary to endocrine disorders: Secondary | ICD-10-CM

## 2023-05-09 DIAGNOSIS — E1165 Type 2 diabetes mellitus with hyperglycemia: Secondary | ICD-10-CM

## 2023-05-09 DIAGNOSIS — I340 Nonrheumatic mitral (valve) insufficiency: Secondary | ICD-10-CM | POA: Diagnosis not present

## 2023-05-09 DIAGNOSIS — Z0130 Encounter for examination of blood pressure without abnormal findings: Secondary | ICD-10-CM

## 2023-05-09 LAB — POCT CBG (FASTING - GLUCOSE)-MANUAL ENTRY: Glucose Fasting, POC: 135 mg/dL — AB (ref 70–99)

## 2023-05-09 MED ORDER — ENALAPRIL MALEATE 5 MG PO TABS: 5.0000 mg | ORAL_TABLET | Freq: Every day | ORAL | 11 refills | Status: DC

## 2023-05-09 NOTE — Progress Notes (Signed)
 Established Patient Office Visit  Subjective:  Patient ID: Megan Shaw, female    DOB: [birth year]  Age: 68 y.o. MRN: [id number]  Chief Complaint  Patient presents with   Follow-up    3 month follow up    Patient comes in for her follow-up today.  She is generally feeling well and has no new complaints.  However she is currently on Norvasc 5 mg for blood pressure control.  She is also on ACE inhibitor, Vasotec  2.5 mg to protect her kidneys.  Discussed with patient, and agreed to stop Norvasc but increase the dose of Vasotec  to 5 mg.  May need to increase further for better blood pressure control, but at least will be on only 1 tablet/day. She is taking all her other medications regularly.  Needs to be checked for hemoglobin A1c today.    No other concerns at this time.   Past Medical History:  Diagnosis Date   Diabetes mellitus without complication (HCC)    Hypertension     History reviewed. No pertinent surgical history.  Social History   Socioeconomic History   Marital status: Divorced    Spouse name: Not on file   Number of children: Not on file   Years of education: Not on file   Highest education level: Not on file  Occupational History   Not on file  Tobacco Use   Smoking status: Never   Smokeless tobacco: Never  Substance and Sexual Activity   Alcohol use: No   Drug use: No   Sexual activity: Not on file  Other Topics Concern   Not on file  Social History Narrative   Not on file   Social Drivers of Health   Financial Resource Strain: Not on file  Food Insecurity: Not on file  Transportation Needs: Not on file  Physical Activity: Not on file  Stress: Not on file  Social Connections: Not on file  Intimate Partner Violence: Not on file    Family History  Problem Relation Age of Onset   Arthritis Mother    Arthritis Father    Breast cancer Cousin     No Known Allergies  Outpatient Medications Prior to Visit  Medication Sig   furosemide   (LASIX ) 20 MG tablet TAKE 1 TABLET BY MOUTH EVERY DAY   ibandronate  (BONIVA ) 150 MG tablet Take 1 tablet (150 mg total) by mouth every 30 (thirty) days. Take in the morning with a full glass of water, on an empty stomach, and do not take anything else by mouth or lie down for the next 30 min.   KLOR-CON M10 10 MEQ tablet TAKE 1 TABLET BY MOUTH EVERY MONDAY,WEDNESDAY, AND FRIDAY WITH LASIX  TABLET   metFORMIN (GLUCOPHAGE) 500 MG tablet TAKE 1 TABLET BY MOUTH TWICE A DAY   rosuvastatin (CRESTOR) 40 MG tablet TAKE 1 TABLET BY MOUTH EVERY DAY   [DISCONTINUED] amLODipine (NORVASC) 5 MG tablet TAKE 1 TABLET BY MOUTH EVERY DAY   [DISCONTINUED] enalapril  (VASOTEC ) 2.5 MG tablet TAKE 1 TABLET BY MOUTH EVERY DAY   docusate sodium  (COLACE) 100 MG capsule Take 2 capsules (200 mg total) by mouth 2 (two) times daily. (Patient not taking: Reported on 05/09/2023)   polyethylene glycol powder (GLYCOLAX /MIRALAX ) powder 2 cap fulls in a full glass of water, three times a day, for 5 days. (Patient not taking: Reported on 05/09/2023)   senna (SENOKOT) 8.6 MG TABS tablet Take 2 tablets (17.2 mg total) by mouth 2 (two) times daily. (Patient not taking:  Reported on 05/09/2023)   No facility-administered medications prior to visit.    Review of Systems  Constitutional: Negative.   HENT: Negative.  Negative for ear discharge, ear pain, hearing loss, nosebleeds, sinus pain and sore throat.   Eyes: Negative.   Respiratory: Negative.  Negative for cough and shortness of breath.   Cardiovascular: Negative.  Negative for chest pain, palpitations and leg swelling.  Gastrointestinal: Negative.  Negative for abdominal pain, constipation, diarrhea, heartburn, nausea and vomiting.  Genitourinary: Negative.  Negative for dysuria and flank pain.  Musculoskeletal: Negative.  Negative for joint pain and myalgias.  Skin: Negative.   Neurological: Negative.  Negative for dizziness, tingling and headaches.  Endo/Heme/Allergies: Negative.    Psychiatric/Behavioral: Negative.  Negative for depression and suicidal ideas. The patient is not nervous/anxious.        Objective:   BP 124/84   Pulse 81   Ht 5' 6 (1.676 m)   Wt 149 lb 3.2 oz (67.7 kg)   SpO2 95%   BMI 24.08 kg/m   Vitals:   05/09/23 0935  BP: 124/84  Pulse: 81  Height: 5' 6 (1.676 m)  Weight: 149 lb 3.2 oz (67.7 kg)  SpO2: 95%  BMI (Calculated): 24.09    Physical Exam Vitals and nursing note reviewed.  Constitutional:      Appearance: Normal appearance.  HENT:     Head: Normocephalic and atraumatic.     Nose: Nose normal.     Mouth/Throat:     Mouth: Mucous membranes are moist.     Pharynx: Oropharynx is clear.  Eyes:     Conjunctiva/sclera: Conjunctivae normal.     Pupils: Pupils are equal, round, and reactive to light.  Cardiovascular:     Rate and Rhythm: Normal rate and regular rhythm.     Pulses: Normal pulses.     Heart sounds: Murmur heard.  Pulmonary:     Effort: Pulmonary effort is normal.     Breath sounds: Normal breath sounds. No wheezing.  Abdominal:     General: Bowel sounds are normal.     Palpations: Abdomen is soft.     Tenderness: There is no abdominal tenderness. There is no right CVA tenderness or left CVA tenderness.  Musculoskeletal:        General: Normal range of motion.     Cervical back: Normal range of motion.     Right lower leg: No edema.     Left lower leg: No edema.  Skin:    General: Skin is warm and dry.  Neurological:     General: No focal deficit present.     Mental Status: She is alert and oriented to person, place, and time.  Psychiatric:        Mood and Affect: Mood normal.        Behavior: Behavior normal.      Results for orders placed or performed in visit on 05/09/23  POCT CBG (Fasting - Glucose)  Result Value Ref Range   Glucose Fasting, POC 135 (A) 70 - 99 mg/dL    Recent Results (from the past 2160 hours)  Lipid panel     Status: None   Collection Time: 05/06/23  8:45 AM   Result Value Ref Range   Cholesterol, Total 117 100 - 199 mg/dL   Triglycerides 894 0 - 149 mg/dL   HDL 44 >60 mg/dL   VLDL Cholesterol Cal 19 5 - 40 mg/dL   LDL Chol Calc (NIH) 54 0 - 99 mg/dL  Chol/HDL Ratio 2.7 0.0 - 4.4 ratio    Comment:                                   T. Chol/HDL Ratio                                             Men  Women                               1/2 Avg.Risk  3.4    3.3                                   Avg.Risk  5.0    4.4                                2X Avg.Risk  9.6    7.1                                3X Avg.Risk 23.4   11.0   POCT CBG (Fasting - Glucose)     Status: Abnormal   Collection Time: 05/09/23  9:39 AM  Result Value Ref Range   Glucose Fasting, POC 135 (A) 70 - 99 mg/dL      Assessment & Plan:  Stop Norvasc.  Increase the dose of Vasotec .  Needs echocardiogram for murmur. Problem List Items Addressed This Visit     Type 2 diabetes mellitus with hyperglycemia, without long-term current use of insulin (HCC) - Primary   Relevant Medications   enalapril  (VASOTEC ) 5 MG tablet   Other Relevant Orders   POCT CBG (Fasting - Glucose) (Completed)   Hemoglobin A1c   Other Visit Diagnoses       Hypertension associated with diabetes (HCC)       Relevant Medications   enalapril  (VASOTEC ) 5 MG tablet   Other Relevant Orders   CMP14+EGFR     Nonrheumatic mitral valve regurgitation       Relevant Medications   enalapril  (VASOTEC ) 5 MG tablet   Other Relevant Orders   PCV ECHOCARDIOGRAM COMPLETE       Return in about 2 weeks (around 05/23/2023).   Total time spent: 30 minutes  FERNAND FREDY RAMAN, MD  05/09/2023   This document may have been prepared by [facility] Voice Recognition software and as such may include unintentional dictation errors.

## 2023-05-10 LAB — CMP14+EGFR
ALT: 17 [IU]/L (ref 0–32)
AST: 18 [IU]/L (ref 0–40)
Albumin: 4.3 g/dL (ref 3.9–4.9)
Alkaline Phosphatase: 56 [IU]/L (ref 44–121)
BUN/Creatinine Ratio: 17 (ref 12–28)
BUN: 14 mg/dL (ref 8–27)
Bilirubin Total: 0.3 mg/dL (ref 0.0–1.2)
CO2: 23 mmol/L (ref 20–29)
Calcium: 9.1 mg/dL (ref 8.7–10.3)
Chloride: 104 mmol/L (ref 96–106)
Creatinine, Ser: 0.83 mg/dL (ref 0.57–1.00)
Globulin, Total: 2.3 g/dL (ref 1.5–4.5)
Glucose: 116 mg/dL — ABNORMAL HIGH (ref 70–99)
Potassium: 4.1 mmol/L (ref 3.5–5.2)
Sodium: 142 mmol/L (ref 134–144)
Total Protein: 6.6 g/dL (ref 6.0–8.5)
eGFR: 76 mL/min/{1.73_m2} (ref >59)

## 2023-05-10 LAB — HEMOGLOBIN A1C
Est. average glucose Bld gHb Est-mCnc: 148 mg/dL
Hgb A1c MFr Bld: 6.8 % — ABNORMAL HIGH (ref 4.8–5.6)

## 2023-05-16 ENCOUNTER — Ambulatory Visit: Payer: Medicare Other

## 2023-05-16 DIAGNOSIS — I340 Nonrheumatic mitral (valve) insufficiency: Secondary | ICD-10-CM | POA: Diagnosis not present

## 2023-05-16 DIAGNOSIS — I351 Nonrheumatic aortic (valve) insufficiency: Secondary | ICD-10-CM | POA: Diagnosis not present

## 2023-05-23 ENCOUNTER — Encounter: Payer: Self-pay | Admitting: Internal Medicine

## 2023-05-23 ENCOUNTER — Ambulatory Visit (INDEPENDENT_AMBULATORY_CARE_PROVIDER_SITE_OTHER): Payer: Medicare Other | Admitting: Internal Medicine

## 2023-05-23 VITALS — BP 128/80 | HR 77 | Ht 66.0 in | Wt 151.4 lb

## 2023-05-23 DIAGNOSIS — I152 Hypertension secondary to endocrine disorders: Secondary | ICD-10-CM | POA: Insufficient documentation

## 2023-05-23 DIAGNOSIS — E782 Mixed hyperlipidemia: Secondary | ICD-10-CM | POA: Diagnosis not present

## 2023-05-23 DIAGNOSIS — E1165 Type 2 diabetes mellitus with hyperglycemia: Secondary | ICD-10-CM | POA: Diagnosis not present

## 2023-05-23 DIAGNOSIS — E1169 Type 2 diabetes mellitus with other specified complication: Secondary | ICD-10-CM | POA: Diagnosis not present

## 2023-05-23 DIAGNOSIS — E1159 Type 2 diabetes mellitus with other circulatory complications: Secondary | ICD-10-CM | POA: Diagnosis not present

## 2023-05-23 LAB — POCT CBG (FASTING - GLUCOSE)-MANUAL ENTRY: Glucose Fasting, POC: 108 mg/dL — AB (ref 70–99)

## 2023-05-23 NOTE — Progress Notes (Signed)
Established Patient Office Visit  Subjective:  Patient ID: Megan Shaw, female    DOB: [birth year]  Age: 68 y.o. MRN: [id number]  Chief Complaint  Patient presents with   Follow-up    2 week follow up    Patient comes in for her blood pressure follow-up.  At her last visit she was taken off Norvasc and her Enalapril dose was increased from 2.5 to 5 mg/day, so she can be only on 1 medicine.  And she was also advised to continue taking her diuretic. Today her blood pressure is 128/80.  She has not taken her diuretic yet.  Will do so once she gets back home. Patient advised to continue her medications.  Lab results discussed.    No other concerns at this time.   Past Medical History:  Diagnosis Date   Diabetes mellitus without complication (HCC)    Hypertension     History reviewed. No pertinent surgical history.  Social History   Socioeconomic History   Marital status: Divorced    Spouse name: Not on file   Number of children: Not on file   Years of education: Not on file   Highest education level: Not on file  Occupational History   Not on file  Tobacco Use   Smoking status: Never   Smokeless tobacco: Never  Substance and Sexual Activity   Alcohol use: No   Drug use: No   Sexual activity: Not on file  Other Topics Concern   Not on file  Social History Narrative   Not on file   Social Drivers of Health   Financial Resource Strain: Not on file  Food Insecurity: Not on file  Transportation Needs: Not on file  Physical Activity: Not on file  Stress: Not on file  Social Connections: Not on file  Intimate Partner Violence: Not on file    Family History  Problem Relation Age of Onset   Arthritis Mother    Arthritis Father    Breast cancer Cousin     No Known Allergies  Outpatient Medications Prior to Visit  Medication Sig   enalapril (VASOTEC) 5 MG tablet Take 1 tablet (5 mg total) by mouth daily.   furosemide (LASIX) 20 MG tablet TAKE 1 TABLET BY  MOUTH EVERY DAY   ibandronate (BONIVA) 150 MG tablet Take 1 tablet (150 mg total) by mouth every 30 (thirty) days. Take in the morning with a full glass of water, on an empty stomach, and do not take anything else by mouth or lie down for the next 30 min.   KLOR-CON M10 10 MEQ tablet TAKE 1 TABLET BY MOUTH EVERY MONDAY,WEDNESDAY, AND FRIDAY WITH LASIX TABLET   metFORMIN (GLUCOPHAGE) 500 MG tablet TAKE 1 TABLET BY MOUTH TWICE A DAY   rosuvastatin (CRESTOR) 40 MG tablet TAKE 1 TABLET BY MOUTH EVERY DAY   docusate sodium (COLACE) 100 MG capsule Take 2 capsules (200 mg total) by mouth 2 (two) times daily. (Patient not taking: Reported on 12/27/2022)   polyethylene glycol powder (GLYCOLAX/MIRALAX) powder 2 cap fulls in a full glass of water, three times a day, for 5 days. (Patient not taking: Reported on 12/27/2022)   senna (SENOKOT) 8.6 MG TABS tablet Take 2 tablets (17.2 mg total) by mouth 2 (two) times daily. (Patient not taking: Reported on 12/27/2022)   No facility-administered medications prior to visit.    Review of Systems  Constitutional: Negative.  Negative for chills, fever, malaise/fatigue and weight loss.  HENT: Negative.  Eyes: Negative.   Respiratory: Negative.  Negative for cough and shortness of breath.   Cardiovascular: Negative.  Negative for chest pain, palpitations and leg swelling.  Gastrointestinal: Negative.  Negative for abdominal pain, constipation, diarrhea, heartburn, nausea and vomiting.  Genitourinary: Negative.  Negative for dysuria and flank pain.  Musculoskeletal: Negative.  Negative for joint pain and myalgias.  Skin: Negative.   Neurological: Negative.  Negative for dizziness and headaches.  Endo/Heme/Allergies: Negative.   Psychiatric/Behavioral: Negative.  Negative for depression and suicidal ideas. The patient is not nervous/anxious.        Objective:   BP 128/80   Pulse 77   Ht 5\' 6"  (1.676 m)   Wt 151 lb 6.4 oz (68.7 kg)   SpO2 93%   BMI 24.44  kg/m   Vitals:   05/23/23 1050 05/23/23 1110  BP: 130/84 128/80  Pulse: 77   Height: 5\' 6"  (1.676 m)   Weight: 151 lb 6.4 oz (68.7 kg)   SpO2: 93%   BMI (Calculated): 24.45     Physical Exam Vitals and nursing note reviewed.  Constitutional:      Appearance: Normal appearance.  HENT:     Head: Normocephalic and atraumatic.     Nose: Nose normal.     Mouth/Throat:     Mouth: Mucous membranes are moist.     Pharynx: Oropharynx is clear.  Eyes:     Conjunctiva/sclera: Conjunctivae normal.     Pupils: Pupils are equal, round, and reactive to light.  Cardiovascular:     Rate and Rhythm: Normal rate and regular rhythm.     Pulses: Normal pulses.     Heart sounds: Normal heart sounds. No murmur heard. Pulmonary:     Effort: Pulmonary effort is normal.     Breath sounds: Normal breath sounds. No wheezing.  Abdominal:     General: Bowel sounds are normal.     Palpations: Abdomen is soft.     Tenderness: There is no abdominal tenderness. There is no right CVA tenderness or left CVA tenderness.  Musculoskeletal:        General: Normal range of motion.     Cervical back: Normal range of motion.     Right lower leg: No edema.     Left lower leg: No edema.  Skin:    General: Skin is warm and dry.  Neurological:     General: No focal deficit present.     Mental Status: She is alert and oriented to person, place, and time.  Psychiatric:        Mood and Affect: Mood normal.        Behavior: Behavior normal.      Results for orders placed or performed in visit on 05/23/23  POCT CBG (Fasting - Glucose)  Result Value Ref Range   Glucose Fasting, POC 108 (A) 70 - 99 mg/dL    Recent Results (from the past 2160 hours)  Lipid panel     Status: None   Collection Time: 05/06/23  8:45 AM  Result Value Ref Range   Cholesterol, Total 117 100 - 199 mg/dL   Triglycerides 161 0 - 149 mg/dL   HDL 44 >09 mg/dL   VLDL Cholesterol Cal 19 5 - 40 mg/dL   LDL Chol Calc (NIH) 54 0 - 99  mg/dL   Chol/HDL Ratio 2.7 0.0 - 4.4 ratio    Comment:  T. Chol/HDL Ratio                                             Men  Women                               1/2 Avg.Risk  3.4    3.3                                   Avg.Risk  5.0    4.4                                2X Avg.Risk  9.6    7.1                                3X Avg.Risk 23.4   11.0   POCT CBG (Fasting - Glucose)     Status: Abnormal   Collection Time: 05/09/23  9:39 AM  Result Value Ref Range   Glucose Fasting, POC 135 (A) 70 - 99 mg/dL  [id number]     Status: Abnormal   Collection Time: 05/09/23 10:24 AM  Result Value Ref Range   Glucose 116 (H) 70 - 99 mg/dL   BUN 14 8 - 27 mg/dL   Creatinine, Ser 4.09 0.57 - 1.00 mg/dL   eGFR 76 >81 XB/JYN/8.29   BUN/Creatinine Ratio 17 12 - 28   Sodium 142 134 - 144 mmol/L   Potassium 4.1 3.5 - 5.2 mmol/L   Chloride 104 96 - 106 mmol/L   CO2 23 20 - 29 mmol/L   Calcium 9.1 8.7 - 10.3 mg/dL   Total Protein 6.6 6.0 - 8.5 g/dL   Albumin 4.3 3.9 - 4.9 g/dL   Globulin, Total 2.3 1.5 - 4.5 g/dL   Bilirubin Total 0.3 0.0 - 1.2 mg/dL   Alkaline Phosphatase 56 44 - 121 IU/L   AST 18 0 - 40 IU/L   ALT 17 0 - 32 IU/L  Hemoglobin A1c     Status: Abnormal   Collection Time: 05/09/23 10:24 AM  Result Value Ref Range   Hgb A1c MFr Bld 6.8 (H) 4.8 - 5.6 %    Comment:          Prediabetes: 5.7 - 6.4          Diabetes: >6.4          Glycemic control for adults with diabetes: <7.0    Est. average glucose Bld gHb Est-mCnc 148 mg/dL  POCT CBG (Fasting - Glucose)     Status: Abnormal   Collection Time: 05/23/23 10:53 AM  Result Value Ref Range   Glucose Fasting, POC 108 (A) 70 - 99 mg/dL      Assessment & Plan:  Patient advised to continue all her medications.  Strict diet control and exercise emphasized. Continue current medications.  Monitor blood pressure at home. Problem List Items Addressed This Visit     Type 2 diabetes mellitus with  hyperglycemia, without long-term current use of insulin (HCC)   Relevant Orders   POCT CBG (Fasting - Glucose) (Completed)   Hypertension associated with diabetes (HCC) -  Primary   Combined hyperlipidemia associated with type 2 diabetes mellitus (HCC)    Return in about 6 weeks (around 07/04/2023).   Total time spent: 30 minutes  Margaretann Loveless, MD  05/23/2023   This document may have been prepared by [facility] Voice Recognition software and as such may include unintentional dictation errors.

## 2023-07-07 ENCOUNTER — Ambulatory Visit: Payer: Medicare Other | Admitting: Internal Medicine

## 2023-07-07 ENCOUNTER — Encounter: Payer: Self-pay | Admitting: Internal Medicine

## 2023-07-07 VITALS — BP 128/78 | HR 90 | Ht 66.0 in | Wt 147.4 lb

## 2023-07-07 DIAGNOSIS — E1169 Type 2 diabetes mellitus with other specified complication: Secondary | ICD-10-CM | POA: Diagnosis not present

## 2023-07-07 DIAGNOSIS — E1165 Type 2 diabetes mellitus with hyperglycemia: Secondary | ICD-10-CM

## 2023-07-07 DIAGNOSIS — E1159 Type 2 diabetes mellitus with other circulatory complications: Secondary | ICD-10-CM

## 2023-07-07 DIAGNOSIS — E782 Mixed hyperlipidemia: Secondary | ICD-10-CM

## 2023-07-07 DIAGNOSIS — I152 Hypertension secondary to endocrine disorders: Secondary | ICD-10-CM

## 2023-07-07 LAB — POCT CBG (FASTING - GLUCOSE)-MANUAL ENTRY: Glucose Fasting, POC: 124 mg/dL — AB (ref 70–99)

## 2023-07-07 MED ORDER — GLUCOSE BLOOD VI STRP: ORAL_STRIP | 12 refills | Status: DC

## 2023-07-07 MED ORDER — ENALAPRIL MALEATE 10 MG PO TABS: 10.0000 mg | ORAL_TABLET | Freq: Every day | ORAL | 11 refills | Status: AC

## 2023-07-07 NOTE — Progress Notes (Signed)
 Established Patient Office Visit  Subjective:  Patient ID: Megan Shaw, female    DOB: 05-29-1955  Age: 68 y.o. MRN: [id number]  Chief Complaint  Patient presents with   Follow-up    6 week follow up    Patient comes in for her follow-up today.  She has lost some weight and her blood pressure looks better.  However at home she is still getting some elevated blood pressure readings.  Will increase her Vasotec to 10 mg/day.  Continue Lasix.  Patient encouraged to exercise and control her diet to help her lose more weight.    No other concerns at this time.   Past Medical History:  Diagnosis Date   Diabetes mellitus without complication (HCC)    Hypertension     History reviewed. No pertinent surgical history.  Social History   Socioeconomic History   Marital status: Divorced    Spouse name: Not on file   Number of children: Not on file   Years of education: Not on file   Highest education level: Not on file  Occupational History   Not on file  Tobacco Use   Smoking status: Never   Smokeless tobacco: Never  Substance and Sexual Activity   Alcohol use: No   Drug use: No   Sexual activity: Not on file  Other Topics Concern   Not on file  Social History Narrative   Not on file   Social Drivers of Health   Financial Resource Strain: Not on file  Food Insecurity: Not on file  Transportation Needs: Not on file  Physical Activity: Not on file  Stress: Not on file  Social Connections: Not on file  Intimate Partner Violence: Not on file    Family History  Problem Relation Age of Onset   Arthritis Mother    Arthritis Father    Breast cancer Cousin     No Known Allergies  Outpatient Medications Prior to Visit  Medication Sig   docusate sodium (COLACE) 100 MG capsule Take 2 capsules (200 mg total) by mouth 2 (two) times daily. (Patient not taking: Reported on 12/27/2022)   furosemide (LASIX) 20 MG tablet TAKE 1 TABLET BY MOUTH EVERY DAY   ibandronate  (BONIVA) 150 MG tablet Take 1 tablet (150 mg total) by mouth every 30 (thirty) days. Take in the morning with a full glass of water, on an empty stomach, and do not take anything else by mouth or lie down for the next 30 min.   KLOR-CON M10 10 MEQ tablet TAKE 1 TABLET BY MOUTH EVERY MONDAY,WEDNESDAY, AND FRIDAY WITH LASIX TABLET   metFORMIN (GLUCOPHAGE) 500 MG tablet TAKE 1 TABLET BY MOUTH TWICE A DAY   polyethylene glycol powder (GLYCOLAX/MIRALAX) powder 2 cap fulls in a full glass of water, three times a day, for 5 days. (Patient not taking: Reported on 12/27/2022)   rosuvastatin (CRESTOR) 40 MG tablet TAKE 1 TABLET BY MOUTH EVERY DAY   senna (SENOKOT) 8.6 MG TABS tablet Take 2 tablets (17.2 mg total) by mouth 2 (two) times daily. (Patient not taking: Reported on 12/27/2022)   [DISCONTINUED] enalapril (VASOTEC) 5 MG tablet Take 1 tablet (5 mg total) by mouth daily.   No facility-administered medications prior to visit.    Review of Systems  Constitutional: Negative.  Negative for chills, fever and malaise/fatigue.  HENT: Negative.  Negative for ear pain and sore throat.   Eyes: Negative.   Respiratory: Negative.  Negative for cough and shortness of breath.  Cardiovascular: Negative.  Negative for chest pain, palpitations and leg swelling.  Gastrointestinal: Negative.  Negative for abdominal pain, constipation, diarrhea, heartburn, nausea and vomiting.  Genitourinary: Negative.  Negative for dysuria and flank pain.  Musculoskeletal: Negative.  Negative for joint pain and myalgias.  Skin: Negative.   Neurological: Negative.  Negative for dizziness, tingling, tremors and headaches.  Endo/Heme/Allergies: Negative.   Psychiatric/Behavioral: Negative.  Negative for depression and suicidal ideas. The patient is not nervous/anxious.        Objective:   BP 128/78   Pulse 90   Ht 5\' 6"  (1.676 m)   Wt 147 lb 6.4 oz (66.9 kg)   SpO2 95%   BMI 23.79 kg/m   Vitals:   07/07/23 1341  BP:  128/78  Pulse: 90  Height: 5\' 6"  (1.676 m)  Weight: 147 lb 6.4 oz (66.9 kg)  SpO2: 95%  BMI (Calculated): 23.8    Physical Exam Vitals and nursing note reviewed.  Constitutional:      Appearance: Normal appearance.  HENT:     Head: Normocephalic and atraumatic.     Nose: Nose normal.     Mouth/Throat:     Mouth: Mucous membranes are moist.     Pharynx: Oropharynx is clear.  Eyes:     Conjunctiva/sclera: Conjunctivae normal.     Pupils: Pupils are equal, round, and reactive to light.  Cardiovascular:     Rate and Rhythm: Normal rate and regular rhythm.     Pulses: Normal pulses.     Heart sounds: Normal heart sounds. No murmur heard. Pulmonary:     Effort: Pulmonary effort is normal.     Breath sounds: Normal breath sounds. No wheezing.  Abdominal:     General: Bowel sounds are normal.     Palpations: Abdomen is soft.     Tenderness: There is no abdominal tenderness. There is no right CVA tenderness or left CVA tenderness.  Musculoskeletal:        General: Normal range of motion.     Cervical back: Normal range of motion.     Right lower leg: No edema.     Left lower leg: No edema.  Skin:    General: Skin is warm and dry.  Neurological:     General: No focal deficit present.     Mental Status: She is alert and oriented to person, place, and time.  Psychiatric:        Mood and Affect: Mood normal.        Behavior: Behavior normal.      Results for orders placed or performed in visit on 07/07/23  POCT CBG (Fasting - Glucose)  Result Value Ref Range   Glucose Fasting, POC 124 (A) 70 - 99 mg/dL    Recent Results (from the past 2160 hours)  Lipid panel     Status: None   Collection Time: 05/06/23  8:45 AM  Result Value Ref Range   Cholesterol, Total 117 100 - 199 mg/dL   Triglycerides 119 0 - 149 mg/dL   HDL 44 >14 mg/dL   VLDL Cholesterol Cal 19 5 - 40 mg/dL   LDL Chol Calc (NIH) 54 0 - 99 mg/dL   Chol/HDL Ratio 2.7 0.0 - 4.4 ratio    Comment:                                    T. Chol/HDL Ratio  Men  Women                               1/2 Avg.Risk  3.4    3.3                                   Avg.Risk  5.0    4.4                                2X Avg.Risk  9.6    7.1                                3X Avg.Risk 23.4   11.0   POCT CBG (Fasting - Glucose)     Status: Abnormal   Collection Time: 05/09/23  9:39 AM  Result Value Ref Range   Glucose Fasting, POC 135 (A) 70 - 99 mg/dL  [id number]     Status: Abnormal   Collection Time: 05/09/23 10:24 AM  Result Value Ref Range   Glucose 116 (H) 70 - 99 mg/dL   BUN 14 8 - 27 mg/dL   Creatinine, Ser 9.52 0.57 - 1.00 mg/dL   eGFR 76 >84 XL/KGM/0.10   BUN/Creatinine Ratio 17 12 - 28   Sodium 142 134 - 144 mmol/L   Potassium 4.1 3.5 - 5.2 mmol/L   Chloride 104 96 - 106 mmol/L   CO2 23 20 - 29 mmol/L   Calcium 9.1 8.7 - 10.3 mg/dL   Total Protein 6.6 6.0 - 8.5 g/dL   Albumin 4.3 3.9 - 4.9 g/dL   Globulin, Total 2.3 1.5 - 4.5 g/dL   Bilirubin Total 0.3 0.0 - 1.2 mg/dL   Alkaline Phosphatase 56 44 - 121 IU/L   AST 18 0 - 40 IU/L   ALT 17 0 - 32 IU/L  Hemoglobin A1c     Status: Abnormal   Collection Time: 05/09/23 10:24 AM  Result Value Ref Range   Hgb A1c MFr Bld 6.8 (H) 4.8 - 5.6 %    Comment:          Prediabetes: 5.7 - 6.4          Diabetes: >6.4          Glycemic control for adults with diabetes: <7.0    Est. average glucose Bld gHb Est-mCnc 148 mg/dL  POCT CBG (Fasting - Glucose)     Status: Abnormal   Collection Time: 05/23/23 10:53 AM  Result Value Ref Range   Glucose Fasting, POC 108 (A) 70 - 99 mg/dL  POCT CBG (Fasting - Glucose)     Status: Abnormal   Collection Time: 07/07/23  1:47 PM  Result Value Ref Range   Glucose Fasting, POC 124 (A) 70 - 99 mg/dL      Assessment & Plan:  Increase Vasotec to 10 mg/day.  Monitor blood pressure at home. Problem List Items Addressed This Visit     Type 2 diabetes mellitus with hyperglycemia,  without long-term current use of insulin (HCC)   Relevant Medications   enalapril (VASOTEC) 10 MG tablet   glucose blood test strip   Other Relevant Orders   POCT CBG (Fasting - Glucose) (Completed)   Hypertension associated with diabetes (HCC) - Primary   Relevant  Medications   enalapril (VASOTEC) 10 MG tablet   Combined hyperlipidemia associated with type 2 diabetes mellitus (HCC)   Relevant Medications   enalapril (VASOTEC) 10 MG tablet    Return in about 6 weeks (around 08/18/2023).   Total time spent: 25 minutes  Margaretann Loveless, MD  07/07/2023   This document may have been prepared by [facility] Voice Recognition software and as such may include unintentional dictation errors.

## 2023-08-18 ENCOUNTER — Ambulatory Visit: Admitting: Internal Medicine

## 2023-08-23 ENCOUNTER — Other Ambulatory Visit

## 2023-08-23 DIAGNOSIS — E1159 Type 2 diabetes mellitus with other circulatory complications: Secondary | ICD-10-CM

## 2023-08-23 DIAGNOSIS — E1165 Type 2 diabetes mellitus with hyperglycemia: Secondary | ICD-10-CM

## 2023-08-23 DIAGNOSIS — Z1329 Encounter for screening for other suspected endocrine disorder: Secondary | ICD-10-CM

## 2023-08-23 DIAGNOSIS — E782 Mixed hyperlipidemia: Secondary | ICD-10-CM

## 2023-08-24 LAB — CMP14+EGFR
ALT: 21 IU/L (ref 0–32)
AST: 20 IU/L (ref 0–40)
Albumin: 4.3 g/dL (ref 3.9–4.9)
Alkaline Phosphatase: 46 IU/L (ref 44–121)
BUN/Creatinine Ratio: 16 (ref 12–28)
BUN: 15 mg/dL (ref 8–27)
Bilirubin Total: 0.4 mg/dL (ref 0.0–1.2)
CO2: 26 mmol/L (ref 20–29)
Calcium: 9.6 mg/dL (ref 8.7–10.3)
Chloride: 102 mmol/L (ref 96–106)
Creatinine, Ser: 0.92 mg/dL (ref 0.57–1.00)
Globulin, Total: 2.2 g/dL (ref 1.5–4.5)
Glucose: 101 mg/dL — ABNORMAL HIGH (ref 70–99)
Potassium: 4.4 mmol/L (ref 3.5–5.2)
Sodium: 140 mmol/L (ref 134–144)
Total Protein: 6.5 g/dL (ref 6.0–8.5)
eGFR: 67 mL/min/{1.73_m2} (ref >59)

## 2023-08-24 LAB — LIPID PANEL
Chol/HDL Ratio: 2.4 ratio (ref 0.0–4.4)
Cholesterol, Total: 131 mg/dL (ref 100–199)
HDL: 54 mg/dL (ref >39)
LDL Chol Calc (NIH): 62 mg/dL (ref 0–99)
Triglycerides: 78 mg/dL (ref 0–149)
VLDL Cholesterol Cal: 15 mg/dL (ref 5–40)

## 2023-08-24 LAB — TSH: TSH: 0.641 u[IU]/mL (ref 0.450–4.500)

## 2023-08-24 LAB — HEMOGLOBIN A1C
Est. average glucose Bld gHb Est-mCnc: 148 mg/dL
Hgb A1c MFr Bld: 6.8 % — ABNORMAL HIGH (ref 4.8–5.6)

## 2023-08-25 ENCOUNTER — Encounter: Payer: Self-pay | Admitting: Internal Medicine

## 2023-08-25 ENCOUNTER — Ambulatory Visit (INDEPENDENT_AMBULATORY_CARE_PROVIDER_SITE_OTHER): Admitting: Internal Medicine

## 2023-08-25 VITALS — BP 108/82 | HR 82 | Ht 66.0 in | Wt 147.4 lb

## 2023-08-25 DIAGNOSIS — Z1231 Encounter for screening mammogram for malignant neoplasm of breast: Secondary | ICD-10-CM | POA: Diagnosis not present

## 2023-08-25 DIAGNOSIS — I152 Hypertension secondary to endocrine disorders: Secondary | ICD-10-CM | POA: Diagnosis not present

## 2023-08-25 DIAGNOSIS — E1165 Type 2 diabetes mellitus with hyperglycemia: Secondary | ICD-10-CM

## 2023-08-25 DIAGNOSIS — E1159 Type 2 diabetes mellitus with other circulatory complications: Secondary | ICD-10-CM

## 2023-08-25 LAB — POCT CBG (FASTING - GLUCOSE)-MANUAL ENTRY: Glucose Fasting, POC: 97 mg/dL (ref 70–99)

## 2023-08-25 MED ORDER — FUROSEMIDE 20 MG PO TABS: 20.0000 mg | ORAL_TABLET | Freq: Every day | ORAL | 2 refills | Status: AC

## 2023-08-25 NOTE — Progress Notes (Signed)
 Established Patient Office Visit  Subjective:  Patient ID: Megan Shaw, female    DOB: 05-25-1955  Age: 68 y.o. MRN: [id number]  Chief Complaint  Patient presents with   Follow-up    6 week follow up    Patient comes in for follow-up of her blood pressure.  Her dose of enalapril  was increased to 10 mg/day.  She is tolerating it well and her blood pressure looks much better.  She denies headache or dizziness, no chest pain and no shortness of breath.  Labs also discussed, hemoglobin A1c is still at 6.8.  Advised to continue with current medications but exercise and strict diet control emphasized.    No other concerns at this time.   Past Medical History:  Diagnosis Date   Diabetes mellitus without complication (HCC)    Hypertension     No past surgical history on file.  Social History   Socioeconomic History   Marital status: Divorced    Spouse name: Not on file   Number of children: Not on file   Years of education: Not on file   Highest education level: Not on file  Occupational History   Not on file  Tobacco Use   Smoking status: Never   Smokeless tobacco: Never  Substance and Sexual Activity   Alcohol use: No   Drug use: No   Sexual activity: Not on file  Other Topics Concern   Not on file  Social History Narrative   Not on file   Social Drivers of Health   Financial Resource Strain: Not on file  Food Insecurity: Not on file  Transportation Needs: Not on file  Physical Activity: Not on file  Stress: Not on file  Social Connections: Not on file  Intimate Partner Violence: Not on file    Family History  Problem Relation Age of Onset   Arthritis Mother    Arthritis Father    Breast cancer Cousin     No Known Allergies  Outpatient Medications Prior to Visit  Medication Sig   enalapril  (VASOTEC ) 10 MG tablet Take 1 tablet (10 mg total) by mouth daily.   glucose blood test strip Use as instructed   ibandronate  (BONIVA ) 150 MG tablet Take 1  tablet (150 mg total) by mouth every 30 (thirty) days. Take in the morning with a full glass of water, on an empty stomach, and do not take anything else by mouth or lie down for the next 30 min.   KLOR-CON M10 10 MEQ tablet TAKE 1 TABLET BY MOUTH EVERY MONDAY,WEDNESDAY, AND FRIDAY WITH LASIX  TABLET   metFORMIN (GLUCOPHAGE) 500 MG tablet TAKE 1 TABLET BY MOUTH TWICE A DAY   rosuvastatin (CRESTOR) 40 MG tablet TAKE 1 TABLET BY MOUTH EVERY DAY   [DISCONTINUED] furosemide  (LASIX ) 20 MG tablet TAKE 1 TABLET BY MOUTH EVERY DAY   [DISCONTINUED] docusate sodium  (COLACE) 100 MG capsule Take 2 capsules (200 mg total) by mouth 2 (two) times daily. (Patient not taking: Reported on 08/25/2023)   [DISCONTINUED] polyethylene glycol powder (GLYCOLAX /MIRALAX ) powder 2 cap fulls in a full glass of water, three times a day, for 5 days. (Patient not taking: Reported on 08/25/2023)   [DISCONTINUED] senna (SENOKOT) 8.6 MG TABS tablet Take 2 tablets (17.2 mg total) by mouth 2 (two) times daily. (Patient not taking: Reported on 08/25/2023)   No facility-administered medications prior to visit.    Review of Systems  Constitutional: Negative.  Negative for chills, fever, malaise/fatigue and weight loss.  HENT:  Negative.  Negative for sore throat.   Eyes: Negative.   Respiratory: Negative.  Negative for cough and shortness of breath.   Cardiovascular: Negative.  Negative for chest pain, palpitations and leg swelling.  Gastrointestinal: Negative.  Negative for abdominal pain, constipation, diarrhea, heartburn, nausea and vomiting.  Genitourinary: Negative.  Negative for dysuria and flank pain.  Musculoskeletal: Negative.  Negative for joint pain and myalgias.  Skin: Negative.   Neurological: Negative.  Negative for dizziness, tingling, tremors, sensory change and headaches.  Endo/Heme/Allergies: Negative.   Psychiatric/Behavioral: Negative.  Negative for depression and suicidal ideas. The patient is not nervous/anxious.         Objective:   BP 108/82   Pulse 82   Ht 5\' 6"  (1.676 m)   Wt 147 lb 6.4 oz (66.9 kg)   SpO2 96%   BMI 23.79 kg/m   Vitals:   08/25/23 1312  BP: 108/82  Pulse: 82  Height: 5\' 6"  (1.676 m)  Weight: 147 lb 6.4 oz (66.9 kg)  SpO2: 96%  BMI (Calculated): 23.8    Physical Exam Vitals and nursing note reviewed.  Constitutional:      Appearance: Normal appearance.  HENT:     Head: Normocephalic and atraumatic.     Nose: Nose normal.     Mouth/Throat:     Mouth: Mucous membranes are moist.     Pharynx: Oropharynx is clear.  Eyes:     Conjunctiva/sclera: Conjunctivae normal.     Pupils: Pupils are equal, round, and reactive to light.  Cardiovascular:     Rate and Rhythm: Normal rate and regular rhythm.     Pulses: Normal pulses.     Heart sounds: Normal heart sounds. No murmur heard. Pulmonary:     Effort: Pulmonary effort is normal.     Breath sounds: Normal breath sounds. No wheezing.  Abdominal:     General: Bowel sounds are normal.     Palpations: Abdomen is soft.     Tenderness: There is no abdominal tenderness. There is no right CVA tenderness or left CVA tenderness.  Musculoskeletal:        General: Normal range of motion.     Cervical back: Normal range of motion.     Right lower leg: No edema.     Left lower leg: No edema.  Skin:    General: Skin is warm and dry.  Neurological:     General: No focal deficit present.     Mental Status: She is alert and oriented to person, place, and time.  Psychiatric:        Mood and Affect: Mood normal.        Behavior: Behavior normal.      Results for orders placed or performed in visit on 08/25/23  POCT CBG (Fasting - Glucose)  Result Value Ref Range   Glucose Fasting, POC 97 70 - 99 mg/dL    Recent Results (from the past 2160 hours)  POCT CBG (Fasting - Glucose)     Status: Abnormal   Collection Time: 07/07/23  1:47 PM  Result Value Ref Range   Glucose Fasting, POC 124 (A) 70 - 99 mg/dL  Hemoglobin  Z6X     Status: Abnormal   Collection Time: 08/23/23  8:53 AM  Result Value Ref Range   Hgb A1c MFr Bld 6.8 (H) 4.8 - 5.6 %    Comment:          Prediabetes: 5.7 - 6.4  Diabetes: >6.4          Glycemic control for adults with diabetes: <7.0    Est. average glucose Bld gHb Est-mCnc 148 mg/dL  TSH     Status: None   Collection Time: 08/23/23  8:53 AM  Result Value Ref Range   TSH 0.641 0.450 - 4.500 uIU/mL  CMP14+EGFR     Status: Abnormal   Collection Time: 08/23/23  8:53 AM  Result Value Ref Range   Glucose 101 (H) 70 - 99 mg/dL   BUN 15 8 - 27 mg/dL   Creatinine, Ser 1.61 0.57 - 1.00 mg/dL   eGFR 67 >09 UE/AVW/0.98   BUN/Creatinine Ratio 16 12 - 28   Sodium 140 134 - 144 mmol/L   Potassium 4.4 3.5 - 5.2 mmol/L   Chloride 102 96 - 106 mmol/L   CO2 26 20 - 29 mmol/L   Calcium 9.6 8.7 - 10.3 mg/dL   Total Protein 6.5 6.0 - 8.5 g/dL   Albumin 4.3 3.9 - 4.9 g/dL   Globulin, Total 2.2 1.5 - 4.5 g/dL   Bilirubin Total 0.4 0.0 - 1.2 mg/dL   Alkaline Phosphatase 46 44 - 121 IU/L   AST 20 0 - 40 IU/L   ALT 21 0 - 32 IU/L  Lipid panel     Status: None   Collection Time: 08/23/23  8:53 AM  Result Value Ref Range   Cholesterol, Total 131 100 - 199 mg/dL   Triglycerides 78 0 - 149 mg/dL   HDL 54 >11 mg/dL   VLDL Cholesterol Cal 15 5 - 40 mg/dL   LDL Chol Calc (NIH) 62 0 - 99 mg/dL   Chol/HDL Ratio 2.4 0.0 - 4.4 ratio    Comment:                                   T. Chol/HDL Ratio                                             Men  Women                               1/2 Avg.Risk  3.4    3.3                                   Avg.Risk  5.0    4.4                                2X Avg.Risk  9.6    7.1                                3X Avg.Risk 23.4   11.0   POCT CBG (Fasting - Glucose)     Status: None   Collection Time: 08/25/23  1:16 PM  Result Value Ref Range   Glucose Fasting, POC 97 70 - 99 mg/dL      Assessment & Plan:  Continue current medications.  Monitor blood  pressure at home.  Strict diet control and physical activity emphasized.  Problem List Items Addressed This Visit     Type 2 diabetes mellitus with hyperglycemia, without long-term current use of insulin (HCC) - Primary   Relevant Orders   POCT CBG (Fasting - Glucose) (Completed)   Hypertension associated with diabetes (HCC)   Relevant Medications   furosemide  (LASIX ) 20 MG tablet   Other Visit Diagnoses       Breast cancer screening by mammogram       Relevant Orders   MM 3D SCREENING MAMMOGRAM BILATERAL BREAST       Return in about 3 months (around 11/24/2023).   Total time spent: 30 minutes  Aisha Hove, MD  08/25/2023   This document may have been prepared by [facility] Voice Recognition software and as such may include unintentional dictation errors.

## 2023-09-03 ENCOUNTER — Other Ambulatory Visit: Payer: Self-pay | Admitting: Internal Medicine

## 2023-09-03 DIAGNOSIS — E1165 Type 2 diabetes mellitus with hyperglycemia: Secondary | ICD-10-CM

## 2023-10-13 ENCOUNTER — Ambulatory Visit
Admission: RE | Admit: 2023-10-13 | Discharge: 2023-10-13 | Disposition: A | Discharge status: Home / Self Care | Payer: Self-pay | Source: Ambulatory Visit | Attending: Internal Medicine | Admitting: Internal Medicine

## 2023-10-13 DIAGNOSIS — Z1231 Encounter for screening mammogram for malignant neoplasm of breast: Secondary | ICD-10-CM | POA: Diagnosis present

## 2023-10-17 ENCOUNTER — Ambulatory Visit: Payer: Self-pay | Admitting: Internal Medicine

## 2023-11-24 ENCOUNTER — Ambulatory Visit: Admitting: Internal Medicine

## 2023-12-21 ENCOUNTER — Other Ambulatory Visit

## 2023-12-21 DIAGNOSIS — Z1329 Encounter for screening for other suspected endocrine disorder: Secondary | ICD-10-CM

## 2023-12-21 DIAGNOSIS — E1165 Type 2 diabetes mellitus with hyperglycemia: Secondary | ICD-10-CM

## 2023-12-21 DIAGNOSIS — E782 Mixed hyperlipidemia: Secondary | ICD-10-CM

## 2023-12-21 DIAGNOSIS — E1159 Type 2 diabetes mellitus with other circulatory complications: Secondary | ICD-10-CM

## 2023-12-22 ENCOUNTER — Ambulatory Visit: Payer: Self-pay | Admitting: Internal Medicine

## 2023-12-22 LAB — LIPID PANEL
Chol/HDL Ratio: 2.7 ratio (ref 0.0–4.4)
Cholesterol, Total: 125 mg/dL (ref 100–199)
HDL: 47 mg/dL (ref >39)
LDL Chol Calc (NIH): 62 mg/dL (ref 0–99)
Triglycerides: 82 mg/dL (ref 0–149)
VLDL Cholesterol Cal: 16 mg/dL (ref 5–40)

## 2023-12-22 LAB — TSH: TSH: 0.664 u[IU]/mL (ref 0.450–4.500)

## 2023-12-22 LAB — CMP14+EGFR
ALT: 20 IU/L (ref 0–32)
AST: 21 IU/L (ref 0–40)
Albumin: 4.3 g/dL (ref 3.9–4.9)
Alkaline Phosphatase: 56 IU/L (ref 44–121)
BUN/Creatinine Ratio: 20 (ref 12–28)
BUN: 17 mg/dL (ref 8–27)
Bilirubin Total: 0.3 mg/dL (ref 0.0–1.2)
CO2: 25 mmol/L (ref 20–29)
Calcium: 9.3 mg/dL (ref 8.7–10.3)
Chloride: 103 mmol/L (ref 96–106)
Creatinine, Ser: 0.86 mg/dL (ref 0.57–1.00)
Globulin, Total: 2.3 g/dL (ref 1.5–4.5)
Glucose: 98 mg/dL (ref 70–99)
Potassium: 4.4 mmol/L (ref 3.5–5.2)
Sodium: 141 mmol/L (ref 134–144)
Total Protein: 6.6 g/dL (ref 6.0–8.5)
eGFR: 73 mL/min/1.73 (ref >59)

## 2023-12-22 LAB — HEMOGLOBIN A1C
Est. average glucose Bld gHb Est-mCnc: 146 mg/dL
Hgb A1c MFr Bld: 6.7 % — ABNORMAL HIGH (ref 4.8–5.6)

## 2023-12-23 ENCOUNTER — Ambulatory Visit: Payer: Self-pay | Admitting: Internal Medicine

## 2023-12-23 ENCOUNTER — Encounter: Payer: Self-pay | Admitting: Internal Medicine

## 2023-12-23 ENCOUNTER — Ambulatory Visit: Admitting: Internal Medicine

## 2023-12-23 VITALS — BP 110/78 | HR 74 | Ht 66.0 in | Wt 150.0 lb

## 2023-12-23 DIAGNOSIS — E1159 Type 2 diabetes mellitus with other circulatory complications: Secondary | ICD-10-CM | POA: Diagnosis not present

## 2023-12-23 DIAGNOSIS — E782 Mixed hyperlipidemia: Secondary | ICD-10-CM | POA: Diagnosis not present

## 2023-12-23 DIAGNOSIS — M8588 Other specified disorders of bone density and structure, other site: Secondary | ICD-10-CM | POA: Diagnosis not present

## 2023-12-23 DIAGNOSIS — E1165 Type 2 diabetes mellitus with hyperglycemia: Secondary | ICD-10-CM | POA: Diagnosis not present

## 2023-12-23 DIAGNOSIS — M8580 Other specified disorders of bone density and structure, unspecified site: Secondary | ICD-10-CM | POA: Insufficient documentation

## 2023-12-23 DIAGNOSIS — E1169 Type 2 diabetes mellitus with other specified complication: Secondary | ICD-10-CM

## 2023-12-23 DIAGNOSIS — I152 Hypertension secondary to endocrine disorders: Secondary | ICD-10-CM

## 2023-12-23 LAB — POCT CBG (FASTING - GLUCOSE)-MANUAL ENTRY: Glucose Fasting, POC: 105 mg/dL — AB (ref 70–99)

## 2023-12-23 MED ORDER — IBANDRONATE SODIUM 150 MG PO TABS: 150.0000 mg | ORAL_TABLET | ORAL | 3 refills | Status: AC

## 2023-12-23 NOTE — Progress Notes (Signed)
 Established Patient Office Visit  Subjective:  Patient ID: Megan Shaw, female    DOB: 1956/01/28  Age: 68 y.o. MRN: [id number]  Chief Complaint  Patient presents with   Follow-up    3 month follow up    Patient was seen today for her 3 month follow up appointment. She reports feeling well and has no complaints today. She denies any headaches, dizziness, chest pain, shortness of breath, nausea, vomiting, diarrhea. She does report she has been having difficulty getting her Boniva  filled at her current pharmacy and is requesting a refill be sent to [facility] of [facility]. Her labs were normal and her HbgA1c was mildly improved.    No other concerns at this time.   Past Medical History:  Diagnosis Date   Diabetes mellitus without complication (HCC)    Hypertension     History reviewed. No pertinent surgical history.  Social History   Socioeconomic History   Marital status: Divorced    Spouse name: Not on file   Number of children: Not on file   Years of education: Not on file   Highest education level: Not on file  Occupational History   Not on file  Tobacco Use   Smoking status: Never   Smokeless tobacco: Never  Substance and Sexual Activity   Alcohol use: No   Drug use: No   Sexual activity: Not on file  Other Topics Concern   Not on file  Social History Narrative   Not on file   Social Drivers of Health   Financial Resource Strain: Not on file  Food Insecurity: Not on file  Transportation Needs: Not on file  Physical Activity: Not on file  Stress: Not on file  Social Connections: Not on file  Intimate Partner Violence: Not on file    Family History  Problem Relation Age of Onset   Arthritis Mother    Arthritis Father    Breast cancer Cousin     No Known Allergies  Outpatient Medications Prior to Visit  Medication Sig   enalapril  (VASOTEC ) 10 MG tablet Take 1 tablet (10 mg total) by mouth daily.   furosemide  (LASIX ) 20 MG tablet Take 1  tablet (20 mg total) by mouth daily.   glucose blood test strip Use as instructed   KLOR-CON M10 10 MEQ tablet TAKE 1 TABLET BY MOUTH EVERY MONDAY,WEDNESDAY, AND FRIDAY WITH LASIX  TABLET   metFORMIN (GLUCOPHAGE) 500 MG tablet TAKE 1 TABLET BY MOUTH TWICE A DAY   rosuvastatin (CRESTOR) 40 MG tablet TAKE 1 TABLET BY MOUTH EVERY DAY   [DISCONTINUED] ibandronate  (BONIVA ) 150 MG tablet Take 1 tablet (150 mg total) by mouth every 30 (thirty) days. Take in the morning with a full glass of water, on an empty stomach, and do not take anything else by mouth or lie down for the next 30 min.   No facility-administered medications prior to visit.    Review of Systems  Constitutional: Negative.   HENT: Negative.    Eyes: Negative.   Respiratory: Negative.  Negative for cough and shortness of breath.   Cardiovascular: Negative.  Negative for chest pain, palpitations and leg swelling.  Gastrointestinal: Negative.  Negative for abdominal pain, constipation, diarrhea, heartburn, nausea and vomiting.  Genitourinary: Negative.  Negative for dysuria and flank pain.  Musculoskeletal: Negative.  Negative for joint pain and myalgias.  Skin: Negative.   Neurological: Negative.  Negative for dizziness and headaches.  Endo/Heme/Allergies: Negative.   Psychiatric/Behavioral: Negative.  Negative for depression  and suicidal ideas. The patient is not nervous/anxious.        Objective:   BP 110/78   Pulse 74   Ht 5' 6 (1.676 m)   Wt 150 lb (68 kg)   SpO2 98%   BMI 24.21 kg/m   Vitals:   12/23/23 0955  BP: 110/78  Pulse: 74  Height: 5' 6 (1.676 m)  Weight: 150 lb (68 kg)  SpO2: 98%  BMI (Calculated): 24.22    Physical Exam Vitals and nursing note reviewed.  Constitutional:      Appearance: Normal appearance.  HENT:     Head: Normocephalic and atraumatic.     Nose: Nose normal.     Mouth/Throat:     Mouth: Mucous membranes are moist.     Pharynx: Oropharynx is clear.  Eyes:      Conjunctiva/sclera: Conjunctivae normal.     Pupils: Pupils are equal, round, and reactive to light.  Cardiovascular:     Rate and Rhythm: Normal rate and regular rhythm.     Pulses: Normal pulses.     Heart sounds: Normal heart sounds. No murmur heard. Pulmonary:     Effort: Pulmonary effort is normal.     Breath sounds: Normal breath sounds. No wheezing.  Abdominal:     General: Bowel sounds are normal.     Palpations: Abdomen is soft.     Tenderness: There is no abdominal tenderness. There is no right CVA tenderness or left CVA tenderness.  Musculoskeletal:        General: Normal range of motion.     Cervical back: Normal range of motion.     Right lower leg: No edema.     Left lower leg: No edema.  Skin:    General: Skin is warm and dry.  Neurological:     General: No focal deficit present.     Mental Status: She is alert and oriented to person, place, and time.  Psychiatric:        Mood and Affect: Mood normal.        Behavior: Behavior normal.      Results for orders placed or performed in visit on 12/23/23  POCT CBG (Fasting - Glucose)  Result Value Ref Range   Glucose Fasting, POC 105 (A) 70 - 99 mg/dL    Recent Results (from the past 2160 hours)  CMP14+EGFR     Status: None   Collection Time: 12/21/23  8:41 AM  Result Value Ref Range   Glucose 98 70 - 99 mg/dL   BUN 17 8 - 27 mg/dL   Creatinine, Ser 9.13 0.57 - 1.00 mg/dL   eGFR 73 >40 fO/fpw/8.26   BUN/Creatinine Ratio 20 12 - 28   Sodium 141 134 - 144 mmol/L   Potassium 4.4 3.5 - 5.2 mmol/L   Chloride 103 96 - 106 mmol/L   CO2 25 20 - 29 mmol/L   Calcium 9.3 8.7 - 10.3 mg/dL   Total Protein 6.6 6.0 - 8.5 g/dL   Albumin 4.3 3.9 - 4.9 g/dL   Globulin, Total 2.3 1.5 - 4.5 g/dL   Bilirubin Total 0.3 0.0 - 1.2 mg/dL   Alkaline Phosphatase 56 44 - 121 IU/L   AST 21 0 - 40 IU/L   ALT 20 0 - 32 IU/L  Lipid panel     Status: None   Collection Time: 12/21/23  8:41 AM  Result Value Ref Range   Cholesterol,  Total 125 100 - 199 mg/dL   Triglycerides  82 0 - 149 mg/dL   HDL 47 >60 mg/dL   VLDL Cholesterol Cal 16 5 - 40 mg/dL   LDL Chol Calc (NIH) 62 0 - 99 mg/dL   Chol/HDL Ratio 2.7 0.0 - 4.4 ratio    Comment:                                   T. Chol/HDL Ratio                                             Men  Women                               1/2 Avg.Risk  3.4    3.3                                   Avg.Risk  5.0    4.4                                2X Avg.Risk  9.6    7.1                                3X Avg.Risk 23.4   11.0   Hemoglobin A1c     Status: Abnormal   Collection Time: 12/21/23  8:41 AM  Result Value Ref Range   Hgb A1c MFr Bld 6.7 (H) 4.8 - 5.6 %    Comment:          Prediabetes: 5.7 - 6.4          Diabetes: >6.4          Glycemic control for adults with diabetes: <7.0    Est. average glucose Bld gHb Est-mCnc 146 mg/dL  TSH     Status: None   Collection Time: 12/21/23  8:41 AM  Result Value Ref Range   TSH 0.664 0.450 - 4.500 uIU/mL  POCT CBG (Fasting - Glucose)     Status: Abnormal   Collection Time: 12/23/23  9:59 AM  Result Value Ref Range   Glucose Fasting, POC 105 (A) 70 - 99 mg/dL      Assessment & Plan:  Continue medications as prescribed. Follow up labs before next appointment.  Problem List Items Addressed This Visit     Type 2 diabetes mellitus with hyperglycemia, without long-term current use of insulin (HCC) - Primary   Relevant Orders   POCT CBG (Fasting - Glucose) (Completed)   Hypertension associated with diabetes (HCC)   Combined hyperlipidemia associated with type 2 diabetes mellitus (HCC)   Osteopenia   Relevant Medications   ibandronate  (BONIVA ) 150 MG tablet    Return in about 4 months (around 04/23/2024).   Total time spent: 20 minutes  FERNAND FREDY RAMAN, MD  12/23/2023   This document may have been prepared by [facility] Voice Recognition software and as such may include unintentional dictation errors.

## 2024-02-29 ENCOUNTER — Other Ambulatory Visit: Payer: Self-pay | Admitting: Internal Medicine

## 2024-03-06 ENCOUNTER — Other Ambulatory Visit: Payer: Self-pay | Admitting: Internal Medicine

## 2024-03-06 DIAGNOSIS — E1165 Type 2 diabetes mellitus with hyperglycemia: Secondary | ICD-10-CM

## 2024-05-04 ENCOUNTER — Other Ambulatory Visit

## 2024-05-04 DIAGNOSIS — Z1329 Encounter for screening for other suspected endocrine disorder: Secondary | ICD-10-CM

## 2024-05-04 DIAGNOSIS — E782 Mixed hyperlipidemia: Secondary | ICD-10-CM

## 2024-05-04 DIAGNOSIS — E1159 Type 2 diabetes mellitus with other circulatory complications: Secondary | ICD-10-CM

## 2024-05-04 DIAGNOSIS — E1165 Type 2 diabetes mellitus with hyperglycemia: Secondary | ICD-10-CM

## 2024-05-05 LAB — HEMOGLOBIN A1C
Est. average glucose Bld gHb Est-mCnc: 148 mg/dL
Hgb A1c MFr Bld: 6.8 % — ABNORMAL HIGH (ref 4.8–5.6)

## 2024-05-05 LAB — CMP14+EGFR
ALT: 22 IU/L (ref 0–32)
AST: 16 IU/L (ref 0–40)
Albumin: 4.1 g/dL (ref 3.9–4.9)
Alkaline Phosphatase: 49 IU/L (ref 49–135)
BUN/Creatinine Ratio: 15 (ref 12–28)
BUN: 14 mg/dL (ref 8–27)
Bilirubin Total: 0.4 mg/dL (ref 0.0–1.2)
CO2: 27 mmol/L (ref 20–29)
Calcium: 9.3 mg/dL (ref 8.7–10.3)
Chloride: 103 mmol/L (ref 96–106)
Creatinine, Ser: 0.92 mg/dL (ref 0.57–1.00)
Globulin, Total: 2.1 g/dL (ref 1.5–4.5)
Glucose: 123 mg/dL — ABNORMAL HIGH (ref 70–99)
Potassium: 3.5 mmol/L (ref 3.5–5.2)
Sodium: 144 mmol/L (ref 134–144)
Total Protein: 6.2 g/dL (ref 6.0–8.5)
eGFR: 67 mL/min/1.73

## 2024-05-05 LAB — LIPID PANEL
Chol/HDL Ratio: 2.3 ratio (ref 0.0–4.4)
Cholesterol, Total: 107 mg/dL (ref 100–199)
HDL: 46 mg/dL
LDL Chol Calc (NIH): 43 mg/dL (ref 0–99)
Triglycerides: 91 mg/dL (ref 0–149)
VLDL Cholesterol Cal: 18 mg/dL (ref 5–40)

## 2024-05-05 LAB — TSH: TSH: 0.547 u[IU]/mL (ref 0.450–4.500)

## 2024-05-07 ENCOUNTER — Ambulatory Visit: Payer: Self-pay | Admitting: Internal Medicine

## 2024-05-07 ENCOUNTER — Encounter: Payer: Self-pay | Admitting: Internal Medicine

## 2024-05-07 ENCOUNTER — Ambulatory Visit: Admitting: Internal Medicine

## 2024-05-07 VITALS — BP 126/74 | HR 76 | Ht 66.0 in | Wt 150.6 lb

## 2024-05-07 DIAGNOSIS — E1169 Type 2 diabetes mellitus with other specified complication: Secondary | ICD-10-CM

## 2024-05-07 DIAGNOSIS — E782 Mixed hyperlipidemia: Secondary | ICD-10-CM | POA: Diagnosis not present

## 2024-05-07 DIAGNOSIS — E1159 Type 2 diabetes mellitus with other circulatory complications: Secondary | ICD-10-CM

## 2024-05-07 DIAGNOSIS — I152 Hypertension secondary to endocrine disorders: Secondary | ICD-10-CM

## 2024-05-07 DIAGNOSIS — E1165 Type 2 diabetes mellitus with hyperglycemia: Secondary | ICD-10-CM | POA: Diagnosis not present

## 2024-05-07 LAB — POC CREATINE & ALBUMIN,URINE
Creatinine, POC: 300 mg/dL
Microalbumin Ur, POC: 150 mg/L

## 2024-05-07 LAB — POCT CBG (FASTING - GLUCOSE)-MANUAL ENTRY: Glucose Fasting, POC: 170 mg/dL — AB (ref 70–99)

## 2024-05-07 MED ORDER — METFORMIN HCL 1000 MG PO TABS: 1000.0000 mg | ORAL_TABLET | Freq: Two times a day (BID) | ORAL | 3 refills | Status: AC

## 2024-05-07 NOTE — Progress Notes (Signed)
 "  Established Patient Office Visit  Subjective:  Patient ID: Megan Shaw, female    DOB: [birth year]  Age: 69 y.o. MRN: [id number]  Chief Complaint  Patient presents with   Follow-up    4 month follow up    Patient is here today for follow up. She reports feeling well today and has no new concerns to discuss at this time. She recently had routine blood work completed and will discuss in detail today.  Patient HbgA1c has remained elevated. Will increase Metformin  to 1000 mg twice daily and start checking fasting blood sugars daily. Patient reports having glucose monitoring kit at home and will begin checking her blood sugars. Reinforced healthy diet and exercise as tolerated.      No other concerns at this time.   Past Medical History:  Diagnosis Date   Diabetes mellitus without complication (HCC)    Hypertension     History reviewed. No pertinent surgical history.  Social History   Socioeconomic History   Marital status: Divorced    Spouse name: Not on file   Number of children: Not on file   Years of education: Not on file   Highest education level: Not on file  Occupational History   Not on file  Tobacco Use   Smoking status: Never   Smokeless tobacco: Never  Substance and Sexual Activity   Alcohol use: No   Drug use: No   Sexual activity: Not on file  Other Topics Concern   Not on file  Social History Narrative   Not on file   Social Drivers of Health   Tobacco Use: Low Risk (05/07/2024)   Patient History    Smoking Tobacco Use: Never    Smokeless Tobacco Use: Never    Passive Exposure: Not on file  Financial Resource Strain: Not on file  Food Insecurity: Not on file  Transportation Needs: Not on file  Physical Activity: Not on file  Stress: Not on file  Social Connections: Not on file  Intimate Partner Violence: Not on file  Depression (PHQ2-9): Low Risk (05/23/2023)   Depression (PHQ2-9)    PHQ-2 Score: 0  Alcohol Screen: Not on file  Housing:  Not on file  Utilities: Not on file  Health Literacy: Not on file    Family History  Problem Relation Age of Onset   Arthritis Mother    Arthritis Father    Breast cancer Cousin     Allergies[1]  Show/hide medication list[2]  Review of Systems  Constitutional: Negative.  Negative for chills, fever and malaise/fatigue.  HENT: Negative.  Negative for congestion and sore throat.   Eyes: Negative.  Negative for blurred vision and pain.  Respiratory: Negative.  Negative for cough and shortness of breath.   Cardiovascular: Negative.  Negative for chest pain, palpitations and leg swelling.  Gastrointestinal: Negative.  Negative for abdominal pain, blood in stool, constipation, diarrhea, heartburn, melena, nausea and vomiting.  Genitourinary: Negative.  Negative for dysuria, flank pain, frequency and urgency.  Musculoskeletal: Negative.  Negative for joint pain and myalgias.  Skin: Negative.   Neurological: Negative.  Negative for dizziness, tingling, sensory change, weakness and headaches.  Endo/Heme/Allergies: Negative.   Psychiatric/Behavioral: Negative.  Negative for depression and suicidal ideas. The patient is not nervous/anxious.        Objective:   BP 126/74   Pulse 76   Ht 5' 6 (1.676 m)   Wt 150 lb 9.6 oz (68.3 kg)   SpO2 97%   BMI 24.31  kg/m   Vitals:   05/07/24 1018  BP: 126/74  Pulse: 76  Height: 5' 6 (1.676 m)  Weight: 150 lb 9.6 oz (68.3 kg)  SpO2: 97%  BMI (Calculated): 24.32    Physical Exam Vitals and nursing note reviewed.  Constitutional:      Appearance: Normal appearance.  HENT:     Head: Normocephalic and atraumatic.     Nose: Nose normal.     Mouth/Throat:     Mouth: Mucous membranes are moist.     Pharynx: Oropharynx is clear.  Eyes:     Conjunctiva/sclera: Conjunctivae normal.     Pupils: Pupils are equal, round, and reactive to light.  Cardiovascular:     Rate and Rhythm: Normal rate and regular rhythm.     Pulses: Normal  pulses.     Heart sounds: Normal heart sounds. No murmur heard. Pulmonary:     Effort: Pulmonary effort is normal.     Breath sounds: Normal breath sounds. No wheezing.  Abdominal:     General: Bowel sounds are normal.     Palpations: Abdomen is soft.     Tenderness: There is no abdominal tenderness. There is no right CVA tenderness or left CVA tenderness.  Musculoskeletal:        General: Normal range of motion.     Cervical back: Normal range of motion.     Right lower leg: No edema.     Left lower leg: No edema.  Skin:    General: Skin is warm and dry.  Neurological:     General: No focal deficit present.     Mental Status: She is alert and oriented to person, place, and time.  Psychiatric:        Mood and Affect: Mood normal.        Behavior: Behavior normal.      Results for orders placed or performed in visit on 05/07/24  POCT CBG (Fasting - Glucose)  Result Value Ref Range   Glucose Fasting, POC 170 (A) 70 - 99 mg/dL  POC CREATINE & ALBUMIN,URINE  Result Value Ref Range   Microalbumin Ur, POC 150 mg/L   Creatinine, POC 300 mg/dL   Albumin/Creatinine Ratio, Urine, POC 30-300     Recent Results (from the past 2160 hours)  TSH     Status: None   Collection Time: 05/04/24  8:48 AM  Result Value Ref Range   TSH 0.547 0.450 - 4.500 uIU/mL  Hemoglobin A1c     Status: Abnormal   Collection Time: 05/04/24  8:48 AM  Result Value Ref Range   Hgb A1c MFr Bld 6.8 (H) 4.8 - 5.6 %    Comment:          Prediabetes: 5.7 - 6.4          Diabetes: >6.4          Glycemic control for adults with diabetes: <7.0    Est. average glucose Bld gHb Est-mCnc 148 mg/dL  Lipid panel     Status: None   Collection Time: 05/04/24  8:48 AM  Result Value Ref Range   Cholesterol, Total 107 100 - 199 mg/dL   Triglycerides 91 0 - 149 mg/dL   HDL 46 >60 mg/dL   VLDL Cholesterol Cal 18 5 - 40 mg/dL   LDL Chol Calc (NIH) 43 0 - 99 mg/dL   Chol/HDL Ratio 2.3 0.0 - 4.4 ratio    Comment:  T. Chol/HDL Ratio                                             Men  Women                               1/2 Avg.Risk  3.4    3.3                                   Avg.Risk  5.0    4.4                                2X Avg.Risk  9.6    7.1                                3X Avg.Risk 23.4   11.0   CMP14+EGFR     Status: Abnormal   Collection Time: 05/04/24  8:48 AM  Result Value Ref Range   Glucose 123 (H) 70 - 99 mg/dL   BUN 14 8 - 27 mg/dL   Creatinine, Ser 9.07 0.57 - 1.00 mg/dL   eGFR 67 >40 fO/fpw/8.26   BUN/Creatinine Ratio 15 12 - 28   Sodium 144 134 - 144 mmol/L   Potassium 3.5 3.5 - 5.2 mmol/L   Chloride 103 96 - 106 mmol/L   CO2 27 20 - 29 mmol/L   Calcium 9.3 8.7 - 10.3 mg/dL   Total Protein 6.2 6.0 - 8.5 g/dL   Albumin 4.1 3.9 - 4.9 g/dL   Globulin, Total 2.1 1.5 - 4.5 g/dL   Bilirubin Total 0.4 0.0 - 1.2 mg/dL   Alkaline Phosphatase 49 49 - 135 IU/L   AST 16 0 - 40 IU/L   ALT 22 0 - 32 IU/L  POCT CBG (Fasting - Glucose)     Status: Abnormal   Collection Time: 05/07/24 10:22 AM  Result Value Ref Range   Glucose Fasting, POC 170 (A) 70 - 99 mg/dL  POC CREATINE & ALBUMIN,URINE     Status: Abnormal   Collection Time: 05/07/24 10:57 AM  Result Value Ref Range   Microalbumin Ur, POC 150 mg/L   Creatinine, POC 300 mg/dL   Albumin/Creatinine Ratio, Urine, POC 30-300       Assessment & Plan:  Increase metformin  to 1000 mg twice daily. Reinforced healthy diet and exercise as tolerated. 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 Check UAC today. Problem List Items Addressed This Visit       Cardiovascular and Mediastinum   Hypertension associated with diabetes (HCC)   Relevant Medications   metFORMIN  (GLUCOPHAGE ) 1000 MG tablet     Endocrine   Type 2 diabetes mellitus with hyperglycemia, without long-term current use of insulin (HCC) - Primary   Relevant Medications   metFORMIN  (GLUCOPHAGE )  1000 MG tablet   Other Relevant Orders   POCT CBG (Fasting - Glucose) (Completed)   POC CREATINE & ALBUMIN,URINE (Completed)   Combined hyperlipidemia associated with type 2 diabetes mellitus (HCC)   Relevant Medications   metFORMIN  (GLUCOPHAGE ) 1000 MG tablet  Return in about 1 month (around 06/07/2024).   Total time spent: 25 minutes. This time includes review of previous notes and results and patient face to face interaction during today's visit.    FERNAND FREDY RAMAN, MD  05/07/2024   This document may have been prepared by [facility] Voice Recognition software and as such may include unintentional dictation errors.     [1] No Known Allergies [2]  Outpatient Medications Prior to Visit  Medication Sig   enalapril  (VASOTEC ) 10 MG tablet Take 1 tablet (10 mg total) by mouth daily.   furosemide  (LASIX ) 20 MG tablet Take 1 tablet (20 mg total) by mouth daily.   glucose blood test strip Use as instructed   ibandronate  (BONIVA ) 150 MG tablet Take 1 tablet (150 mg total) by mouth every 30 (thirty) days. Take in the morning with a full glass of water, on an empty stomach, and do not take anything else by mouth or lie down for the next 30 min.   potassium chloride (KLOR-CON M) 10 MEQ tablet TAKE 1 TABLET BY MOUTH EVERY MONDAY,WEDNESDAY, AND FRIDAY WITH LASIX  TABLET   rosuvastatin (CRESTOR) 40 MG tablet TAKE 1 TABLET BY MOUTH EVERY DAY   [DISCONTINUED] metFORMIN  (GLUCOPHAGE ) 500 MG tablet TAKE 1 TABLET BY MOUTH TWICE A DAY   No facility-administered medications prior to visit.   "

## 2024-05-11 ENCOUNTER — Telehealth: Payer: Self-pay

## 2024-05-11 DIAGNOSIS — E1165 Type 2 diabetes mellitus with hyperglycemia: Secondary | ICD-10-CM

## 2024-05-11 MED ORDER — CONTOUR NEXT TEST VI STRP: ORAL_STRIP | 12 refills | Status: AC

## 2024-05-11 NOTE — Addendum Note (Signed)
 Addended by: FERNAND FREDY RAMAN on: 05/11/2024 09:06 AM   Modules accepted: Orders

## 2024-05-11 NOTE — Telephone Encounter (Signed)
 Patients insurance will no longer cover one touch diabetic devices they are covering Contour items can we send in the new device lancets and test strips for the patient

## 2024-06-07 ENCOUNTER — Ambulatory Visit: Payer: Self-pay | Admitting: Internal Medicine

## 2024-06-07 ENCOUNTER — Encounter: Payer: Self-pay | Admitting: Internal Medicine

## 2024-06-07 ENCOUNTER — Ambulatory Visit (INDEPENDENT_AMBULATORY_CARE_PROVIDER_SITE_OTHER): Admitting: Internal Medicine

## 2024-06-07 VITALS — BP 132/78 | HR 79 | Ht 66.0 in | Wt 150.2 lb

## 2024-06-07 DIAGNOSIS — E1165 Type 2 diabetes mellitus with hyperglycemia: Secondary | ICD-10-CM | POA: Diagnosis not present

## 2024-06-07 DIAGNOSIS — E1159 Type 2 diabetes mellitus with other circulatory complications: Secondary | ICD-10-CM

## 2024-06-07 DIAGNOSIS — I152 Hypertension secondary to endocrine disorders: Secondary | ICD-10-CM

## 2024-06-07 DIAGNOSIS — E782 Mixed hyperlipidemia: Secondary | ICD-10-CM

## 2024-06-07 DIAGNOSIS — E1169 Type 2 diabetes mellitus with other specified complication: Secondary | ICD-10-CM

## 2024-06-07 LAB — POCT CBG (FASTING - GLUCOSE)-MANUAL ENTRY: Glucose Fasting, POC: 96 mg/dL (ref 70–99)

## 2024-06-07 NOTE — Progress Notes (Signed)
 "  Established Patient Office Visit  Subjective:  Patient ID: Megan Shaw, female    DOB: 02-20-1956  Age: 69 y.o. MRN: [id number]  Chief Complaint  Patient presents with   Follow-up    1 month follow up    Patient comes in for her follow up today. At her last visit her Metformin  was increased to 1000 mg , 1po bid- as her Hgba1c was still high-. Today she reports that she is tolerating it well, without nausea, upset stomach. She is checking her fingerstick glucose regularly now, and getting good readings.    No other concerns at this time.   Past Medical History:  Diagnosis Date   Diabetes mellitus without complication (HCC)    Hypertension     History reviewed. No pertinent surgical history.  Social History   Socioeconomic History   Marital status: Divorced    Spouse name: Not on file   Number of children: Not on file   Years of education: Not on file   Highest education level: Not on file  Occupational History   Not on file  Tobacco Use   Smoking status: Never   Smokeless tobacco: Never  Substance and Sexual Activity   Alcohol use: No   Drug use: No   Sexual activity: Not on file  Other Topics Concern   Not on file  Social History Narrative   Not on file   Social Drivers of Health   Tobacco Use: Low Risk (06/07/2024)   Patient History    Smoking Tobacco Use: Never    Smokeless Tobacco Use: Never    Passive Exposure: Not on file  Financial Resource Strain: Not on file  Food Insecurity: Not on file  Transportation Needs: Not on file  Physical Activity: Not on file  Stress: Not on file  Social Connections: Not on file  Intimate Partner Violence: Not on file  Depression (PHQ2-9): Low Risk (05/23/2023)   Depression (PHQ2-9)    PHQ-2 Score: 0  Alcohol Screen: Not on file  Housing: Not on file  Utilities: Not on file  Health Literacy: Not on file    Family History  Problem Relation Age of Onset   Arthritis Mother    Arthritis Father    Breast cancer  Cousin     Allergies[1]  Show/hide medication list[2]  Review of Systems  Constitutional: Negative.  Negative for chills, fever and malaise/fatigue.  HENT: Negative.  Negative for congestion and sore throat.   Eyes: Negative.  Negative for blurred vision and pain.  Respiratory: Negative.  Negative for cough and shortness of breath.   Cardiovascular: Negative.  Negative for chest pain, palpitations and leg swelling.  Gastrointestinal: Negative.  Negative for abdominal pain, blood in stool, constipation, diarrhea, heartburn, melena, nausea and vomiting.  Genitourinary: Negative.  Negative for dysuria, flank pain, frequency and urgency.  Musculoskeletal: Negative.  Negative for joint pain and myalgias.  Skin: Negative.   Neurological: Negative.  Negative for dizziness, tingling, sensory change, weakness and headaches.  Endo/Heme/Allergies: Negative.   Psychiatric/Behavioral: Negative.  Negative for depression and suicidal ideas. The patient is not nervous/anxious.        Objective:   BP 132/78   Pulse 79   Ht 5' 6 (1.676 m)   Wt 150 lb 3.2 oz (68.1 kg)   SpO2 98%   BMI 24.24 kg/m   Vitals:   06/07/24 1030  BP: 132/78  Pulse: 79  Height: 5' 6 (1.676 m)  Weight: 150 lb 3.2 oz (68.1  kg)  SpO2: 98%  BMI (Calculated): 24.25    Physical Exam Vitals and nursing note reviewed.  Constitutional:      Appearance: Normal appearance.  HENT:     Head: Normocephalic and atraumatic.     Nose: Nose normal.     Mouth/Throat:     Mouth: Mucous membranes are moist.     Pharynx: Oropharynx is clear.  Eyes:     Conjunctiva/sclera: Conjunctivae normal.     Pupils: Pupils are equal, round, and reactive to light.  Cardiovascular:     Rate and Rhythm: Normal rate and regular rhythm.     Pulses: Normal pulses.     Heart sounds: Murmur heard.  Pulmonary:     Effort: Pulmonary effort is normal.     Breath sounds: Normal breath sounds. No wheezing.  Abdominal:     General: Bowel  sounds are normal.     Palpations: Abdomen is soft.     Tenderness: There is no abdominal tenderness. There is no right CVA tenderness or left CVA tenderness.  Musculoskeletal:        General: Normal range of motion.     Cervical back: Normal range of motion.     Right lower leg: No edema.     Left lower leg: No edema.  Skin:    General: Skin is warm and dry.  Neurological:     General: No focal deficit present.     Mental Status: She is alert and oriented to person, place, and time.  Psychiatric:        Mood and Affect: Mood normal.        Behavior: Behavior normal.      Results for orders placed or performed in visit on 06/07/24  POCT CBG (Fasting - Glucose)  Result Value Ref Range   Glucose Fasting, POC 96 70 - 99 mg/dL    Recent Results (from the past 2160 hours)  TSH     Status: None   Collection Time: 05/04/24  8:48 AM  Result Value Ref Range   TSH 0.547 0.450 - 4.500 uIU/mL  Hemoglobin A1c     Status: Abnormal   Collection Time: 05/04/24  8:48 AM  Result Value Ref Range   Hgb A1c MFr Bld 6.8 (H) 4.8 - 5.6 %    Comment:          Prediabetes: 5.7 - 6.4          Diabetes: >6.4          Glycemic control for adults with diabetes: <7.0    Est. average glucose Bld gHb Est-mCnc 148 mg/dL  Lipid panel     Status: None   Collection Time: 05/04/24  8:48 AM  Result Value Ref Range   Cholesterol, Total 107 100 - 199 mg/dL   Triglycerides 91 0 - 149 mg/dL   HDL 46 >60 mg/dL   VLDL Cholesterol Cal 18 5 - 40 mg/dL   LDL Chol Calc (NIH) 43 0 - 99 mg/dL   Chol/HDL Ratio 2.3 0.0 - 4.4 ratio    Comment:                                   T. Chol/HDL Ratio  Men  Women                               1/2 Avg.Risk  3.4    3.3                                   Avg.Risk  5.0    4.4                                2X Avg.Risk  9.6    7.1                                3X Avg.Risk 23.4   11.0   CMP14+EGFR     Status: Abnormal   Collection  Time: 05/04/24  8:48 AM  Result Value Ref Range   Glucose 123 (H) 70 - 99 mg/dL   BUN 14 8 - 27 mg/dL   Creatinine, Ser 9.07 0.57 - 1.00 mg/dL   eGFR 67 >40 fO/fpw/8.26   BUN/Creatinine Ratio 15 12 - 28   Sodium 144 134 - 144 mmol/L   Potassium 3.5 3.5 - 5.2 mmol/L   Chloride 103 96 - 106 mmol/L   CO2 27 20 - 29 mmol/L   Calcium 9.3 8.7 - 10.3 mg/dL   Total Protein 6.2 6.0 - 8.5 g/dL   Albumin 4.1 3.9 - 4.9 g/dL   Globulin, Total 2.1 1.5 - 4.5 g/dL   Bilirubin Total 0.4 0.0 - 1.2 mg/dL   Alkaline Phosphatase 49 49 - 135 IU/L   AST 16 0 - 40 IU/L   ALT 22 0 - 32 IU/L  POCT CBG (Fasting - Glucose)     Status: Abnormal   Collection Time: 05/07/24 10:22 AM  Result Value Ref Range   Glucose Fasting, POC 170 (A) 70 - 99 mg/dL  POC CREATINE & ALBUMIN,URINE     Status: Abnormal   Collection Time: 05/07/24 10:57 AM  Result Value Ref Range   Microalbumin Ur, POC 150 mg/L   Creatinine, POC 300 mg/dL   Albumin/Creatinine Ratio, Urine, POC 30-300   POCT CBG (Fasting - Glucose)     Status: None   Collection Time: 06/07/24 10:33 AM  Result Value Ref Range   Glucose Fasting, POC 96 70 - 99 mg/dL      Assessment & Plan:  Continue current meds. Strict diet control. Patient will get labs and AWV at next visit. Problem List Items Addressed This Visit       Cardiovascular and Mediastinum   Hypertension associated with diabetes (HCC)     Endocrine   Type 2 diabetes mellitus with hyperglycemia, without long-term current use of insulin (HCC) - Primary   Relevant Orders   POCT CBG (Fasting - Glucose) (Completed)   Combined hyperlipidemia associated with type 2 diabetes mellitus (HCC)    Follow up 3 months for AWV.   Total time spent: 25 minutes. This time includes review of previous notes and results and patient face to face interaction during today's visit.    FERNAND FREDY RAMAN, MD  06/07/2024   This document may have been prepared by [facility] Voice Recognition software and as such  may include unintentional dictation errors.     [1] No Known Allergies [2]  Outpatient  Medications Prior to Visit  Medication Sig   enalapril  (VASOTEC ) 10 MG tablet Take 1 tablet (10 mg total) by mouth daily.   furosemide  (LASIX ) 20 MG tablet Take 1 tablet (20 mg total) by mouth daily.   glucose blood (CONTOUR NEXT TEST) test strip Use as instructed   ibandronate  (BONIVA ) 150 MG tablet Take 1 tablet (150 mg total) by mouth every 30 (thirty) days. Take in the morning with a full glass of water, on an empty stomach, and do not take anything else by mouth or lie down for the next 30 min.   metFORMIN  (GLUCOPHAGE ) 1000 MG tablet Take 1 tablet (1,000 mg total) by mouth 2 (two) times daily with a meal.   potassium chloride (KLOR-CON M) 10 MEQ tablet TAKE 1 TABLET BY MOUTH EVERY MONDAY,WEDNESDAY, AND FRIDAY WITH LASIX  TABLET   rosuvastatin (CRESTOR) 40 MG tablet TAKE 1 TABLET BY MOUTH EVERY DAY   No facility-administered medications prior to visit.   "

## 2024-08-06 ENCOUNTER — Ambulatory Visit
